# Patient Record
Sex: Female | Born: 2005 | Race: White | Hispanic: Yes | Marital: Single | Smoking: Never smoker
Health system: Southern US, Community
[De-identification: ages and names within clinical notes are randomized; demographics above are authoritative.]

## PROBLEM LIST (undated history)

## (undated) HISTORY — PX: NO PAST SURGERIES: SHX2092

---
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
## 2024-01-18 ENCOUNTER — Other Ambulatory Visit: Payer: Self-pay

## 2024-01-18 ENCOUNTER — Emergency Department (HOSPITAL_COMMUNITY)
Admission: EM | Admit: 2024-01-18 | Discharge: 2024-01-19 | Disposition: A | Discharge status: Home / Self Care | Attending: Emergency Medicine | Admitting: Emergency Medicine

## 2024-01-18 ENCOUNTER — Emergency Department (HOSPITAL_COMMUNITY)

## 2024-01-18 ENCOUNTER — Encounter (HOSPITAL_COMMUNITY): Payer: Self-pay | Admitting: Emergency Medicine

## 2024-01-18 DIAGNOSIS — W01198A Fall on same level from slipping, tripping and stumbling with subsequent striking against other object, initial encounter: Secondary | ICD-10-CM | POA: Insufficient documentation

## 2024-01-18 DIAGNOSIS — S99912A Unspecified injury of left ankle, initial encounter: Secondary | ICD-10-CM | POA: Diagnosis present

## 2024-01-18 DIAGNOSIS — Y9318 Activity, surfing, windsurfing and boogie boarding: Secondary | ICD-10-CM | POA: Insufficient documentation

## 2024-01-18 DIAGNOSIS — S82892A Other fracture of left lower leg, initial encounter for closed fracture: Secondary | ICD-10-CM | POA: Diagnosis not present

## 2024-01-18 MED ORDER — OXYCODONE HCL 5 MG PO TABS
5.0000 mg | ORAL_TABLET | Freq: Once | ORAL | Status: AC
  Administered 2024-01-18: 5 mg via ORAL
  Filled 2024-01-18: qty 1

## 2024-01-18 MED ORDER — ACETAMINOPHEN 500 MG PO TABS
1000.0000 mg | ORAL_TABLET | Freq: Once | ORAL | Status: AC
  Administered 2024-01-18: 1000 mg via ORAL
  Filled 2024-01-18: qty 2

## 2024-01-18 NOTE — ED Triage Notes (Addendum)
 Patient c/o left ankle injury. Patient report her ankle got injured after going down the slide. Obvious swelling and deformity on left ankle. 10/10 pain on left ankle.

## 2024-01-18 NOTE — ED Provider Notes (Signed)
 [facility] Provider Note   CSN: [id number] Arrival date & time: 01/18/24  2057     History Chief Complaint  Patient presents with   Joint Swelling    HPI Erin Dudley is a 18 y.o. female presenting for left ankle pain.  States that she went down a water slide and hit her left ankle as she fell.  Denies fevers chills nausea vomiting syncope shortness of breath.  Severe pain at that site.  Neurovascularly intact.  2 hours prior to arrival..   Patient's recorded medical, surgical, social, medication list and allergies were reviewed in the Snapshot window as part of the initial history.   Review of Systems   Review of Systems  Constitutional:  Negative for chills and fever.  HENT:  Negative for ear pain and sore throat.   Eyes:  Negative for pain and visual disturbance.  Respiratory:  Negative for cough and shortness of breath.   Cardiovascular:  Negative for chest pain and palpitations.  Gastrointestinal:  Negative for abdominal pain and vomiting.  Genitourinary:  Negative for dysuria and hematuria.  Musculoskeletal:  Negative for arthralgias and back pain.  Skin:  Negative for color change and rash.  Neurological:  Negative for seizures and syncope.  All other systems reviewed and are negative.   Physical Exam Updated Vital Signs BP 115/70 (BP Location: Left Arm)   Pulse (!) 113   Temp 98.3 F (36.8 C) (Oral)   Resp 20   Ht 5' 3 (1.6 m)   Wt 70.3 kg   LMP  (LMP Unknown)   SpO2 100%   BMI 27.46 kg/m  Physical Exam Vitals and nursing note reviewed.  Constitutional:      General: She is not in acute distress.    Appearance: She is well-developed.  HENT:     Head: Normocephalic and atraumatic.  Eyes:     Conjunctiva/sclera: Conjunctivae normal.  Cardiovascular:     Rate and Rhythm: Normal rate and regular rhythm.     Heart sounds: No murmur heard. Pulmonary:     Effort: Pulmonary effort is normal. No  respiratory distress.     Breath sounds: Normal breath sounds.  Abdominal:     General: There is no distension.     Palpations: Abdomen is soft.     Tenderness: There is no abdominal tenderness. There is no right CVA tenderness or left CVA tenderness.  Musculoskeletal:        General: Swelling, deformity and signs of injury present. No tenderness. Normal range of motion.     Cervical back: Neck supple.  Skin:    General: Skin is warm and dry.  Neurological:     General: No focal deficit present.     Mental Status: She is alert and oriented to person, place, and time. Mental status is at baseline.     Cranial Nerves: No cranial nerve deficit.      ED Course/ Medical Decision Making/ A&P    Procedures Procedures   Medications Ordered in ED Medications  oxyCODONE  (Oxy IR/ROXICODONE ) immediate release tablet 5 mg (5 mg Oral Given 01/18/24 2348)  acetaminophen  (TYLENOL ) tablet 1,000 mg (1,000 mg Oral Given 01/18/24 2347)    Medical Decision Making:   18 year old female presenting with left ankle pain. X-ray shows minimally displaced fractures.  Appears well aligned my review of the x-ray.  Ankle mortise visible in the anterior view. Splinted in a ankle stirrup and referred to orthopedics for outpatient care and  management.  Nonweightbearing status.  Patient educated on management.  Strict return precautions and contact information for outpatient orthopedic given to patient. Disposition:  I have considered need for hospitalization, however, considering all of the above, I believe this patient is stable for discharge at this time.  Patient/family educated about specific return precautions for given chief complaint and symptoms.  Patient/family educated about follow-up with PCP.     Patient/family expressed understanding of return precautions and need for follow-up. Patient spoken to regarding all imaging and laboratory results and appropriate follow up for these results. All education  provided in verbal form with additional information in written form. Time was allowed for answering of patient questions. Patient discharged.    Emergency Department Medication Summary:   Medications  oxyCODONE  (Oxy IR/ROXICODONE ) immediate release tablet 5 mg (5 mg Oral Given 01/18/24 2348)  acetaminophen  (TYLENOL ) tablet 1,000 mg (1,000 mg Oral Given 01/18/24 2347)        Clinical Impression:  1. Closed fracture of left ankle, initial encounter      Discharge   Final Clinical Impression(s) / ED Diagnoses Final diagnoses:  Closed fracture of left ankle, initial encounter    Rx / DC Orders ED Discharge Orders          Ordered    celecoxib  (CELEBREX ) 200 MG capsule  2 times daily        01/19/24 0047              Jerral Meth, MD 01/19/24 [phone]

## 2024-01-19 MED ORDER — CELECOXIB 200 MG PO CAPS: 200.0000 mg | ORAL_CAPSULE | Freq: Two times a day (BID) | ORAL | 0 refills | Status: AC

## 2024-01-19 NOTE — Discharge Instructions (Addendum)
 You were seen today for a fall.  You have a left ankle fracture.  You need to follow-up with orthopedics in the outpatient setting.  You have been placed into a splint for stabilization immediately.  Please call Ortho care in the morning for arranging an appointment within 48 hours.  Return to the emergency department if you are unable to coordinate outpatient care.

## 2024-01-19 NOTE — Progress Notes (Signed)
 Orthopedic Tech Progress Note Patient Details:  Erin Dudley [birth year] [id number]  Ortho Devices Type of Ortho Device: Stirrup splint Ortho Device/Splint Location: LLE Ortho Device/Splint Interventions: Ordered, Application, Adjustment   Post Interventions Patient Tolerated: Fair Instructions Provided: Care of device   [name] [name] 01/19/2024, 1:16 AM

## 2024-01-21 ENCOUNTER — Ambulatory Visit (INDEPENDENT_AMBULATORY_CARE_PROVIDER_SITE_OTHER): Admitting: Orthopedic Surgery

## 2024-01-21 ENCOUNTER — Encounter: Payer: Self-pay | Admitting: Orthopedic Surgery

## 2024-01-21 ENCOUNTER — Other Ambulatory Visit (INDEPENDENT_AMBULATORY_CARE_PROVIDER_SITE_OTHER): Payer: Self-pay

## 2024-01-21 DIAGNOSIS — S82842A Displaced bimalleolar fracture of left lower leg, initial encounter for closed fracture: Secondary | ICD-10-CM | POA: Diagnosis not present

## 2024-01-21 DIAGNOSIS — M25572 Pain in left ankle and joints of left foot: Secondary | ICD-10-CM | POA: Diagnosis not present

## 2024-01-21 MED ORDER — OXYCODONE-ACETAMINOPHEN 5-325 MG PO TABS: 1.0000 | ORAL_TABLET | ORAL | 0 refills | Status: DC | PRN

## 2024-01-21 NOTE — Addendum Note (Signed)
 Addended by: HARDEN LAME on: 01/21/2024 02:35 PM   Modules accepted: Orders

## 2024-01-21 NOTE — Progress Notes (Signed)
 Office Visit Note   Patient: Erin Dudley           Date of Birth: 2005/11/07           MRN: [id number] herminia herminia lets do that yeah thanks Visit Date: 01/21/2024              Requested by: No referring provider defined for this encounter. PCP: Pcp, No  Chief Complaint  Patient presents with   Left Ankle - Pain      HPI: Patient is a 18 year old woman who is seen for initial evaluation for bimalleolar left ankle fracture.  She is seen with her mother.  Patient sustained an injury [place] a water park.  She was placed in a posterior sugar-tong splint at [facility] on August 1 and patient is seen at this time for initial evaluation.    Assessment & Plan: Visit Diagnoses:  1. Pain in left ankle and joints of left foot   2. Closed bimalleolar fracture of left ankle, initial encounter     Plan: With the displaced bimalleolar fracture and widening of the mortise and shortening of the fibula I have recommended proceeding with open reduction internal fixation.  Risks and benefits were discussed including infection neurovascular injury pain, arthritis, need for additional surgery.  Patient and mother state they understand wish to proceed at this time.  Plan for surgery on Wednesday.  Follow-Up Instructions: Return in about 2 weeks (around 02/04/2024).   Ortho Exam  Patient is alert, oriented, no adenopathy, well-dressed, normal affect, normal respiratory effort. Examination patient has a palpable dorsalis pedis pulse.  There is ecchymosis and bruising medially and laterally to the ankle but no blistering.  There is swelling.  Radiographs show shortening of the fibula with displacement of the medial malleolus and lateral malleolus with widening of the mortise.    Imaging: XR Ankle Complete Left Result Date: 01/21/2024 Three-view radiographs of the left ankle shows further displacement of the bimalleolar left ankle fracture with widening of the joint  No images are attached to the  encounter.  Labs: Lab Results  Component Value Date   REPTSTATUS 08/19/2021 FINAL 08/17/2021   CULT (A) 08/17/2021    40,000 COLONIES/mL PROTEUS MIRABILIS NO GROUP B STREP (S.AGALACTIAE) ISOLATED Performed at [facility], [address] [place], [place] [address]    LABORGA PROTEUS MIRABILIS (A) 08/17/2021     No results found for: ALBUMIN, PREALBUMIN, CBC  No results found for: MG No results found for: VD25OH  No results found for: PREALBUMIN    Latest Ref Rng & Units 10/03/2021    7:13 PM 10/03/2021    5:05 AM 10/02/2021    1:32 AM  CBC EXTENDED  WBC 4.0 - 10.5 K/uL  11.2  9.6   RBC 3.87 - 5.11 MIL/uL  2.98  3.80   Hemoglobin 12.0 - 15.0 g/dL 8.8  6.4  8.1   HCT 63.9 - 46.0 % 27.7  21.2  26.9   Platelets 150 - 400 K/uL  261  318      There is no height or weight on file to calculate BMI.  Orders:  Orders Placed This Encounter  Procedures   XR Ankle Complete Left   No orders of the defined types were placed in this encounter.    Procedures: No procedures performed  Clinical Data: No additional findings.  ROS:  All other systems negative, except as noted in the HPI. Review of Systems  Objective: Vital Signs: LMP  (  LMP Unknown)   Specialty Comments:  No specialty comments available.  PMFS History: Patient Active Problem List   Diagnosis Date Noted   Normal labor 10/02/2021   SVD (spontaneous vaginal delivery) 10/02/2021   Iron  deficiency anemia 10/02/2021   Normal postpartum course 10/02/2021   History reviewed. No pertinent past medical history.  Family History  Problem Relation Age of Onset   Hypertension Maternal Grandmother    Diabetes Maternal Grandmother     Past Surgical History:  Procedure Laterality Date   NO PAST SURGERIES     Social History   Occupational History   Not on file  Tobacco Use   Smoking status: Never   Smokeless tobacco: Never  Vaping Use   Vaping status: Former   Substances: Flavoring    Devices: prior to pregnancy  Substance and Sexual Activity   Alcohol use: Never   Drug use: Not Currently    Types: Marijuana    Comment: DENIES SMOKING WHILE PREG   Sexual activity: Not Currently    Birth control/protection: None

## 2024-01-22 ENCOUNTER — Encounter (HOSPITAL_COMMUNITY): Payer: Self-pay | Admitting: Orthopedic Surgery

## 2024-01-22 ENCOUNTER — Other Ambulatory Visit: Payer: Self-pay

## 2024-01-22 NOTE — Progress Notes (Signed)
 SDW CALL  Patient's mother was given pre-op instructions over the phone. The opportunity was given for the patient's mother to ask questions. No further questions asked. Patient's mother verbalized understanding of instructions given.   PCP - denies Cardiologist - denies  PPM/ICD - denies   Chest x-ray - denies EKG - denies Stress Test - denies ECHO - denies Cardiac Cath - denies  Sleep Study - denies  No DM  Last dose of GLP1 agonist-  n/a GLP1 instructions:  n/a  Blood Thinner Instructions: n/a Aspirin Instructions: n/a  ERAS Protcol - clears until 1045 PRE-SURGERY Ensure or G2- n/a  COVID TEST- n/a   Anesthesia review: no

## 2024-01-22 NOTE — Anesthesia Preprocedure Evaluation (Signed)
 Anesthesia Evaluation  Patient identified by MRN, date of birth, ID band Patient awake    Reviewed: Allergy & Precautions, NPO status , Patient's Chart, lab work & pertinent test results  Airway Mallampati: II  TM Distance: >3 FB Neck ROM: Full    Dental no notable dental hx.    Pulmonary neg pulmonary ROS, Patient abstained from smoking.   Pulmonary exam normal breath sounds clear to auscultation       Cardiovascular negative cardio ROS Normal cardiovascular exam Rhythm:Regular Rate:Normal     Neuro/Psych negative neurological ROS     GI/Hepatic negative GI ROS, Neg liver ROS,,,  Endo/Other  negative endocrine ROS    Renal/GU negative Renal ROS     Musculoskeletal negative musculoskeletal ROS (+)    Abdominal Normal abdominal exam  (+)   Peds  Hematology  (+) Blood dyscrasia, anemia   Anesthesia Other Findings   Reproductive/Obstetrics                              Anesthesia Physical Anesthesia Plan  ASA: 1  Anesthesia Plan: General   Post-op Pain Management: Tylenol  PO (pre-op)*, Toradol IV (intra-op)*, Gabapentin  PO (pre-op)* and Regional block*   Induction:   PONV Risk Score and Plan: 2 and Treatment may vary due to age or medical condition, Ondansetron , Dexamethasone  and Midazolam   Airway Management Planned: LMA  Additional Equipment: None  Intra-op Plan:   Post-operative Plan: Extubation in OR  Informed Consent: I have reviewed the patients History and Physical, chart, labs and discussed the procedure including the risks, benefits and alternatives for the proposed anesthesia with the patient or authorized representative who has indicated his/her understanding and acceptance.     Dental advisory given  Plan Discussed with: CRNA  Anesthesia Plan Comments:          Anesthesia Quick Evaluation

## 2024-01-23 ENCOUNTER — Encounter (HOSPITAL_COMMUNITY): Payer: Self-pay | Admitting: Orthopedic Surgery

## 2024-01-23 ENCOUNTER — Ambulatory Visit (HOSPITAL_COMMUNITY): Payer: Self-pay | Admitting: Anesthesiology

## 2024-01-23 ENCOUNTER — Ambulatory Visit (HOSPITAL_COMMUNITY)

## 2024-01-23 ENCOUNTER — Telehealth: Payer: Self-pay | Admitting: Orthopedic Surgery

## 2024-01-23 ENCOUNTER — Ambulatory Visit (HOSPITAL_COMMUNITY)
Admission: RE | Admit: 2024-01-23 | Discharge: 2024-01-23 | Disposition: A | Discharge status: Home / Self Care | Attending: Orthopedic Surgery | Admitting: Orthopedic Surgery

## 2024-01-23 ENCOUNTER — Encounter (HOSPITAL_COMMUNITY)
Admission: RE | Disposition: A | Discharge status: Home / Self Care | Payer: Self-pay | Source: Home / Self Care | Attending: Orthopedic Surgery

## 2024-01-23 ENCOUNTER — Ambulatory Visit (HOSPITAL_BASED_OUTPATIENT_CLINIC_OR_DEPARTMENT_OTHER): Payer: Self-pay | Admitting: Anesthesiology

## 2024-01-23 ENCOUNTER — Other Ambulatory Visit: Payer: Self-pay

## 2024-01-23 DIAGNOSIS — X58XXXA Exposure to other specified factors, initial encounter: Secondary | ICD-10-CM | POA: Insufficient documentation

## 2024-01-23 DIAGNOSIS — Y92831 Amusement park as the place of occurrence of the external cause: Secondary | ICD-10-CM | POA: Insufficient documentation

## 2024-01-23 DIAGNOSIS — S82892A Other fracture of left lower leg, initial encounter for closed fracture: Secondary | ICD-10-CM | POA: Diagnosis present

## 2024-01-23 DIAGNOSIS — S82842A Displaced bimalleolar fracture of left lower leg, initial encounter for closed fracture: Secondary | ICD-10-CM | POA: Diagnosis not present

## 2024-01-23 LAB — CBC
HCT: 35.9 % — ABNORMAL LOW (ref 36.0–46.0)
Hemoglobin: 11.8 g/dL — ABNORMAL LOW (ref 12.0–15.0)
MCH: 27.6 pg (ref 26.0–34.0)
MCHC: 32.9 g/dL (ref 30.0–36.0)
MCV: 84.1 fL (ref 80.0–100.0)
Platelets: 316 K/uL (ref 150–400)
RBC: 4.27 MIL/uL (ref 3.87–5.11)
RDW: 14 % (ref 11.5–15.5)
WBC: 8.9 K/uL (ref 4.0–10.5)
nRBC: 0 % (ref 0.0–0.2)

## 2024-01-23 LAB — POCT PREGNANCY, URINE: Preg Test, Ur: NEGATIVE

## 2024-01-23 SURGERY — OPEN REDUCTION INTERNAL FIXATION (ORIF) ANKLE FRACTURE
Anesthesia: General | Site: Ankle | Laterality: Left

## 2024-01-23 MED ORDER — FENTANYL CITRATE (PF) 100 MCG/2ML IJ SOLN: 50.0000 ug | Freq: Once | INTRAMUSCULAR | Status: DC

## 2024-01-23 MED ORDER — ACETAMINOPHEN 500 MG PO TABS
1000.0000 mg | ORAL_TABLET | Freq: Once | ORAL | Status: AC
  Administered 2024-01-23: 1000 mg via ORAL
  Filled 2024-01-23: qty 2

## 2024-01-23 MED ORDER — MIDAZOLAM HCL 2 MG/2ML IJ SOLN
INTRAMUSCULAR | Status: AC
  Administered 2024-01-23: 2 mg via INTRAVENOUS
  Filled 2024-01-23: qty 2

## 2024-01-23 MED ORDER — ONDANSETRON HCL 4 MG/2ML IJ SOLN
INTRAMUSCULAR | Status: AC
  Filled 2024-01-23: qty 2

## 2024-01-23 MED ORDER — CEFAZOLIN SODIUM-DEXTROSE 2-4 GM/100ML-% IV SOLN
2.0000 g | INTRAVENOUS | Status: AC
  Administered 2024-01-23: 2 g via INTRAVENOUS
  Filled 2024-01-23: qty 100

## 2024-01-23 MED ORDER — MIDAZOLAM HCL 2 MG/2ML IJ SOLN
INTRAMUSCULAR | Status: AC
  Filled 2024-01-23: qty 2

## 2024-01-23 MED ORDER — FENTANYL CITRATE (PF) 100 MCG/2ML IJ SOLN
INTRAMUSCULAR | Status: AC
Start: 2024-01-23 — End: 2024-01-23
  Administered 2024-01-23: 100 ug via INTRAVENOUS
  Filled 2024-01-23: qty 2

## 2024-01-23 MED ORDER — VASHE WOUND IRRIGATION OPTIME
TOPICAL | Status: DC | PRN
  Administered 2024-01-23: 34 [oz_av]

## 2024-01-23 MED ORDER — ROPIVACAINE HCL 5 MG/ML IJ SOLN
INTRAMUSCULAR | Status: DC | PRN
  Administered 2024-01-23: 25 mL via PERINEURAL
  Administered 2024-01-23: 15 mL via PERINEURAL

## 2024-01-23 MED ORDER — 0.9 % SODIUM CHLORIDE (POUR BTL) OPTIME
TOPICAL | Status: DC | PRN
Start: 2024-01-23 — End: 2024-01-23
  Administered 2024-01-23: 1000 mL

## 2024-01-23 MED ORDER — FENTANYL CITRATE (PF) 100 MCG/2ML IJ SOLN
INTRAMUSCULAR | Status: AC
  Filled 2024-01-23: qty 2

## 2024-01-23 MED ORDER — PROPOFOL 500 MG/50ML IV EMUL
INTRAVENOUS | Status: DC | PRN
  Administered 2024-01-23: 125 ug/kg/min via INTRAVENOUS

## 2024-01-23 MED ORDER — MIDAZOLAM HCL 2 MG/2ML IJ SOLN
INTRAMUSCULAR | Status: DC | PRN
  Administered 2024-01-23: 2 mg via INTRAVENOUS

## 2024-01-23 MED ORDER — CLONIDINE HCL (ANALGESIA) 100 MCG/ML EP SOLN
EPIDURAL | Status: DC | PRN
Start: 2024-01-23 — End: 2024-01-23
  Administered 2024-01-23: 30 ug
  Administered 2024-01-23: 50 ug

## 2024-01-23 MED ORDER — FENTANYL CITRATE (PF) 100 MCG/2ML IJ SOLN: 100.0000 ug | Freq: Once | INTRAMUSCULAR | Status: AC

## 2024-01-23 MED ORDER — LACTATED RINGERS IV SOLN: INTRAVENOUS | Status: DC

## 2024-01-23 MED ORDER — FENTANYL CITRATE (PF) 250 MCG/5ML IJ SOLN
INTRAMUSCULAR | Status: AC
  Filled 2024-01-23: qty 5

## 2024-01-23 MED ORDER — DEXMEDETOMIDINE HCL IN NACL 80 MCG/20ML IV SOLN
INTRAVENOUS | Status: AC
  Filled 2024-01-23: qty 20

## 2024-01-23 MED ORDER — CHLORHEXIDINE GLUCONATE 0.12 % MT SOLN
15.0000 mL | OROMUCOSAL | Status: AC
  Administered 2024-01-23: 15 mL via OROMUCOSAL
  Filled 2024-01-23: qty 15

## 2024-01-23 MED ORDER — DEXAMETHASONE SODIUM PHOSPHATE 10 MG/ML IJ SOLN
INTRAMUSCULAR | Status: AC
  Filled 2024-01-23: qty 1

## 2024-01-23 MED ORDER — DEXAMETHASONE SODIUM PHOSPHATE 10 MG/ML IJ SOLN
INTRAMUSCULAR | Status: DC | PRN
  Administered 2024-01-23: 10 mg via INTRAVENOUS

## 2024-01-23 MED ORDER — LIDOCAINE 2% (20 MG/ML) 5 ML SYRINGE
INTRAMUSCULAR | Status: DC | PRN
  Administered 2024-01-23: 70 mg via INTRAVENOUS

## 2024-01-23 MED ORDER — ONDANSETRON HCL 4 MG/2ML IJ SOLN
INTRAMUSCULAR | Status: DC | PRN
  Administered 2024-01-23: 4 mg via INTRAVENOUS

## 2024-01-23 MED ORDER — MIDAZOLAM HCL 2 MG/2ML IJ SOLN: 2.0000 mg | Freq: Once | INTRAMUSCULAR | Status: AC

## 2024-01-23 MED ORDER — FENTANYL CITRATE (PF) 250 MCG/5ML IJ SOLN
INTRAMUSCULAR | Status: DC | PRN
  Administered 2024-01-23: 50 ug via INTRAVENOUS

## 2024-01-23 MED ORDER — DEXAMETHASONE SODIUM PHOSPHATE 4 MG/ML IJ SOLN
INTRAMUSCULAR | Status: DC | PRN
Start: 2024-01-23 — End: 2024-01-23
  Administered 2024-01-23: 3 mg via PERINEURAL
  Administered 2024-01-23: 2 mg via PERINEURAL

## 2024-01-23 MED ORDER — FENTANYL CITRATE (PF) 100 MCG/2ML IJ SOLN
25.0000 ug | INTRAMUSCULAR | Status: DC | PRN
  Administered 2024-01-23: 25 ug via INTRAVENOUS

## 2024-01-23 MED ORDER — DROPERIDOL 2.5 MG/ML IJ SOLN: 0.6250 mg | Freq: Once | INTRAMUSCULAR | Status: DC | PRN

## 2024-01-23 MED ORDER — PROPOFOL 10 MG/ML IV BOLUS
INTRAVENOUS | Status: DC | PRN
  Administered 2024-01-23: 200 mg via INTRAVENOUS

## 2024-01-23 MED ORDER — GABAPENTIN 300 MG PO CAPS
300.0000 mg | ORAL_CAPSULE | Freq: Once | ORAL | Status: AC
  Administered 2024-01-23: 300 mg via ORAL
  Filled 2024-01-23: qty 1

## 2024-01-23 SURGICAL SUPPLY — 37 items
BAG COUNTER SPONGE SURGICOUNT (BAG) ×1 IMPLANT
BANDAGE ESMARK 6X9 LF (GAUZE/BANDAGES/DRESSINGS) IMPLANT
BIT DRILL 110X2.5XQCK CNCT (BIT) IMPLANT
BIT DRILL 2.7XCANN QCK CNCT (BIT) IMPLANT
BNDG COHESIVE 4X5 TAN STRL LF (GAUZE/BANDAGES/DRESSINGS) ×1 IMPLANT
BNDG GAUZE DERMACEA FLUFF 4 (GAUZE/BANDAGES/DRESSINGS) ×1 IMPLANT
COVER SURGICAL LIGHT HANDLE (MISCELLANEOUS) ×1 IMPLANT
DRAPE OEC MINIVIEW 54X84 (DRAPES) IMPLANT
DRAPE U-SHAPE 47X51 STRL (DRAPES) ×1 IMPLANT
DRSG ADAPTIC 3X8 NADH LF (GAUZE/BANDAGES/DRESSINGS) ×1 IMPLANT
DURAPREP 26ML APPLICATOR (WOUND CARE) ×1 IMPLANT
ELECTRODE REM PT RTRN 9FT ADLT (ELECTROSURGICAL) ×1 IMPLANT
GAUZE PAD ABD 8X10 STRL (GAUZE/BANDAGES/DRESSINGS) ×1 IMPLANT
GAUZE SPONGE 4X4 12PLY STRL (GAUZE/BANDAGES/DRESSINGS) ×1 IMPLANT
GLOVE BIOGEL PI IND STRL 9 (GLOVE) ×1 IMPLANT
GLOVE SURG ORTHO 9.0 STRL STRW (GLOVE) ×1 IMPLANT
GOWN STRL REUS W/ TWL XL LVL3 (GOWN DISPOSABLE) ×3 IMPLANT
GUIDEWIRE PIN ORTH 6X1.6XSMTH (WIRE) IMPLANT
KIT BASIN OR (CUSTOM PROCEDURE TRAY) ×1 IMPLANT
KIT TURNOVER KIT B (KITS) ×1 IMPLANT
MANIFOLD NEPTUNE II (INSTRUMENTS) ×1 IMPLANT
NS IRRIG 1000ML POUR BTL (IV SOLUTION) ×1 IMPLANT
PACK ORTHO EXTREMITY (CUSTOM PROCEDURE TRAY) ×1 IMPLANT
PAD ARMBOARD POSITIONER FOAM (MISCELLANEOUS) ×2 IMPLANT
PLATE 6HOLE 1/3 TUBULAR (Plate) IMPLANT
SCREW CANN 1/2THD 40X4.0 (Screw) IMPLANT
SCREW CORT 2.5X20X3.5XST SM (Screw) IMPLANT
SCREW CORTICAL 3.5 16MM (Screw) IMPLANT
SCREW CORTICAL 3.5X12 (Screw) IMPLANT
SCREW CORTICAL 3.5X14 (Screw) IMPLANT
STAPLER SKIN PROX 35W (STAPLE) IMPLANT
SUCTION TUBE FRAZIER 10FR DISP (SUCTIONS) ×1 IMPLANT
SUT ETHILON 2 0 PSLX (SUTURE) IMPLANT
SUT VIC AB 2-0 CT1 TAPERPNT 27 (SUTURE) ×1 IMPLANT
TOWEL GREEN STERILE (TOWEL DISPOSABLE) ×1 IMPLANT
TOWEL GREEN STERILE FF (TOWEL DISPOSABLE) ×1 IMPLANT
TUBE CONNECTING 12X1/4 (SUCTIONS) ×1 IMPLANT

## 2024-01-23 NOTE — Op Note (Signed)
 01/23/2024  2:47 PM  PATIENT:  Erin Dudley    PRE-OPERATIVE DIAGNOSIS: Closed bimalleolar left ankle fracture  POST-OPERATIVE DIAGNOSIS:  Same  PROCEDURE:  OPEN REDUCTION INTERNAL FIXATION (ORIF) ANKLE FRACTURE, LEFT  With internal fixation of the medial malleolus and lateral malleolus. C-arm fluoroscopy to verify reduction.  SURGEON:  Jerona LULLA Sage, MD  PHYSICIAN ASSISTANT:None ANESTHESIA:   General  PREOPERATIVE INDICATIONS:  Erin Dudley is a  18 y.o. female with a diagnosis of left ankle fracture who failed conservative measures and elected for surgical management.    The risks benefits and alternatives were discussed with the patient preoperatively including but not limited to the risks of infection, bleeding, nerve injury, cardiopulmonary complications, the need for revision surgery, among others, and the patient was willing to proceed.  OPERATIVE IMPLANTS:   Implant Name Type Inv. Item Serial No. Manufacturer Lot No. LRB No. Used Action  SCREW CORTICAL 3.5X12 - [id number] Screw SCREW CORTICAL 3.5X12 [id number] [employer](ORTH,TRAU,[employer],SG)  Left 2 Implanted  SCREW CORTICAL 3.5X14 - [id number] Screw SCREW CORTICAL 3.5X14 [id number] [employer](ORTH,TRAU,BIO,SG)  Left 1 Implanted  SCREW CORTICAL 3.5 - [id number] Screw SCREW CORTICAL 3.5 [id number] [employer](ORTH,TRAU,BIO,SG)  Left 1 Implanted  SCREW CORT 2.5X20X3.5XST SM - [id number] Screw SCREW CORT [id number] SM [id number] [employer](ORTH,TRAU,BIO,SG)  Left 1 Implanted  PLATE 6HOLE 1/3 TUBULAR - [id number] Plate PLATE 6HOLE 1/3 TUBULAR [id number] [employer](ORTH,TRAU,BIO,SG)  Left 1 Implanted  SCREW CANN 1/2THD 40X4.0 - [id number] Screw SCREW CANN 1/2THD 40X4.0 [id number] [employer](ORTH,TRAU,BIO,SG)  Left 1 Implanted    @ENCIMAGES @  OPERATIVE FINDINGS: C-arm Prostabrit verified congruent mortise with the fibula out to length and congruent medial  malleolus.  OPERATIVE PROCEDURE: Patient was brought the operating room after undergoing a regional block she then underwent a general anesthetic.  After adequate levels anesthesia were obtained patient's left lower extremity was prepped using DuraPrep draped into a sterile field a timeout was called.  A lateral incision was made over the fibula sharp dissection was carried down to the periosteum.  A elevator was used to debride the tissue from the fracture site.  This fracture was irrigated with Vashe.  The fracture was reduced and stabilized with a interfrag screw.  A neutralization plate was then placed laterally and secured proximally and distally x 2.  C-arm Prostabrit verified the fibula was out to length.  A separate incision was made medially this was carried down to the fracture site.  The fracture was freshened aligned and stabilized with a K wire.  C-arm Shrosbree verified the alignment with a K wire and a 40 mm partially-threaded screw was used to stabilize the fracture.  C-arm Shrosbree was then obtained showed a congruent mortise with inline medial malleolus and lateral malleolus.  The wounds were irrigated with Vashe incision closed and 2-0 nylon sterile dressing was applied patient was extubated taken the PACU in stable condition.   DISCHARGE PLANNING:  Antibiotic duration: Preoperative antibiotics  Weightbearing: Nonweightbearing on the left  Pain medication: Patient has prescription for Percocet  Dressing care/ Wound VAC: Dry dressing  Ambulatory devices: Crutches and fracture boot  Discharge to: Home.  Follow-up: In the office 1 week post operative.

## 2024-01-23 NOTE — H&P (Signed)
 Erin Dudley is an 18 y.o. female.   Chief Complaint: Left ankle fracture HPI: Patient is a 18 year old woman who is seen for initial evaluation for bimalleolar left ankle fracture. She is seen with her mother. Patient sustained an injury at a water park. She was placed in a posterior sugar-tong splint at [facility] on August 1 and patient is seen at this time for initial evaluation.   History reviewed. No pertinent past medical history.  Past Surgical History:  Procedure Laterality Date   NO PAST SURGERIES      Family History  Problem Relation Age of Onset   Hypertension Maternal Grandmother    Diabetes Maternal Grandmother    Social History:  reports that she has never smoked. She has never used smokeless tobacco. She reports that she does not currently use drugs after having used the following drugs: Marijuana. She reports that she does not drink alcohol.  Allergies: No Known Allergies  No medications prior to admission.    No results found for this or any previous visit (from the past 48 hours). XR Ankle Complete Left Result Date: 01/21/2024 Three-view radiographs of the left ankle shows further displacement of the bimalleolar left ankle fracture with widening of the joint   Review of Systems  All other systems reviewed and are negative.   Last menstrual period 12/26/2023, unknown if currently breastfeeding. Physical Exam  Patient is alert, oriented, no adenopathy, well-dressed, normal affect, normal respiratory effort. Examination patient has a palpable dorsalis pedis pulse.  There is ecchymosis and bruising medially and laterally to the ankle but no blistering.  There is swelling.  Radiographs show shortening of the fibula with displacement of the medial malleolus and lateral malleolus with widening of the mortise. Assessment/Plan 1. Pain in left ankle and joints of left foot   2. Closed bimalleolar fracture of left ankle, initial encounter       Plan: With the  displaced bimalleolar fracture and widening of the mortise and shortening of the fibula I have recommended proceeding with open reduction internal fixation.  Risks and benefits were discussed including infection neurovascular injury pain, arthritis, need for additional surgery.  Patient and mother state they understand wish to proceed at this time.  Erin LULLA Sage, MD 01/23/2024, 6:50 AM

## 2024-01-23 NOTE — Anesthesia Procedure Notes (Signed)
 Anesthesia Regional Block: Popliteal block   Pre-Anesthetic Checklist: , timeout performed,  Correct Patient, Correct Site, Correct Laterality,  Correct Procedure, Correct Position, site marked,  Risks and benefits discussed,  Surgical consent,  Pre-op evaluation,  At surgeon's request and post-op pain management  Laterality: Lower and Left  Prep: chloraprep       Needles:  Injection technique: Single-shot  Needle Type: Stimiplex     Needle Length: 10cm  Needle Gauge: 21     Additional Needles:   Procedures:,,,, ultrasound used (permanent image in chart),,   Motor weakness within 5 minutes.  Narrative:  Start time: 01/23/2024 1:14 PM End time: 01/23/2024 1:21 PM Injection made incrementally with aspirations every 5 mL.  Performed by: Personally  Anesthesiologist: Darlyn Rush, MD  Additional Notes: Nerve located and needle positioned with direct ultrasound guidance. Good perineural spread. Patient tolerated well.

## 2024-01-23 NOTE — Interval H&P Note (Signed)
 History and Physical Interval Note:  01/23/2024 1:42 PM  Erin Dudley  has presented today for surgery, with the diagnosis of left ankle fracture.  The various methods of treatment have been discussed with the patient and family. After consideration of risks, benefits and other options for treatment, the patient has consented to  Procedure(s) with comments: OPEN REDUCTION INTERNAL FIXATION (ORIF) ANKLE FRACTURE, LEFT (Left) - left ankle fracture as a surgical intervention.  The patient's history has been reviewed, patient examined, no change in status, stable for surgery.  I have reviewed the patient's chart and labs.  Questions were answered to the patient's satisfaction.     Rayvon Brandvold V Zyan Mirkin

## 2024-01-23 NOTE — Anesthesia Procedure Notes (Signed)
 Anesthesia Regional Block: Adductor canal block   Pre-Anesthetic Checklist: , timeout performed,  Correct Patient, Correct Site, Correct Laterality,  Correct Procedure, Correct Position, site marked,  Risks and benefits discussed,  Surgical consent,  Pre-op evaluation,  At surgeon's request and post-op pain management  Laterality: Lower and Left  Prep: chloraprep       Needles:  Injection technique: Single-shot  Needle Type: Stimiplex     Needle Length: 9cm  Needle Gauge: 21     Additional Needles:   Procedures:,,,, ultrasound used (permanent image in chart),,    Narrative:  Start time: 01/23/2024 1:00 PM End time: 01/23/2024 1:14 PM Injection made incrementally with aspirations every 5 mL.  Performed by: Personally  Anesthesiologist: Darlyn Rush, MD  Additional Notes: BP cuff, EKG monitors applied. Sedation begun. Artery and nerve location verified with ultrasound. Anesthetic injected incrementally (5ml), slowly, and after negative aspirations under direct u/s guidance. Good fascial/perineural spread. Tolerated well.

## 2024-01-23 NOTE — Telephone Encounter (Signed)
 Patient called and said her appointment is too far out and she needs to be put in for a week. Also, she needs a refill on pain medication. CB#[phone]

## 2024-01-23 NOTE — Anesthesia Procedure Notes (Signed)
 Procedure Name: LMA Insertion Date/Time: 01/23/2024 1:50 PM  Performed by: Lyle Delon LABOR, CRNAPre-anesthesia Checklist: Patient identified, Emergency Drugs available, Suction available and Patient being monitored Patient Re-evaluated:Patient Re-evaluated prior to induction Oxygen Delivery Method: Circle system utilized Preoxygenation: Pre-oxygenation with 100% oxygen Induction Type: IV induction Ventilation: Mask ventilation without difficulty LMA: LMA inserted LMA Size: 4.0 Tube type: Oral Number of attempts: 1 Airway Equipment and Method: Stylet and Oral airway Placement Confirmation: positive ETCO2 and breath sounds checked- equal and bilateral Tube secured with: Tape Dental Injury: Teeth and Oropharynx as per pre-operative assessment

## 2024-01-23 NOTE — Transfer of Care (Signed)
 Immediate Anesthesia Transfer of Care Note  Patient: Erin Dudley  Procedure(s) Performed: OPEN REDUCTION INTERNAL FIXATION (ORIF) ANKLE FRACTURE, LEFT (Left: Ankle)  Patient Location: PACU  Anesthesia Type:General  Level of Consciousness: awake  Airway & Oxygen Therapy: Patient Spontanous Breathing  Post-op Assessment: Report given to RN and Post -op Vital signs reviewed and stable  Post vital signs: Reviewed and stable  Last Vitals:  Vitals Value Taken Time  BP    Temp    Pulse 69 01/23/24 15:06  Resp    SpO2 98 % 01/23/24 15:06    Last Pain:  Vitals:   01/23/24 1210  TempSrc:   PainSc: 0-No pain      Patients Stated Pain Goal: 0 (01/23/24 1210)  Complications: No notable events documented.

## 2024-01-24 ENCOUNTER — Encounter (HOSPITAL_COMMUNITY): Payer: Self-pay | Admitting: Orthopedic Surgery

## 2024-01-24 NOTE — Anesthesia Postprocedure Evaluation (Signed)
 Anesthesia Post Note  Patient: Erin Dudley  Procedure(s) Performed: OPEN REDUCTION INTERNAL FIXATION (ORIF) ANKLE FRACTURE, LEFT (Left: Ankle)     Patient location during evaluation: PACU Anesthesia Type: General Level of consciousness: sedated and patient cooperative Pain management: pain level controlled Vital Signs Assessment: post-procedure vital signs reviewed and stable Respiratory status: spontaneous breathing Cardiovascular status: stable Anesthetic complications: no   No notable events documented.  Last Vitals:  Vitals:   01/23/24 1545 01/23/24 1600  BP: 107/63 116/72  Pulse: (!) 58 97  Resp: (!) 9 15  Temp:  36.6 C  SpO2: 100% 98%    Last Pain:  Vitals:   01/23/24 1538  TempSrc:   PainSc: 7                  Norleen Pope

## 2024-01-24 NOTE — Telephone Encounter (Signed)
 Pt is sch for a week out as per protocol and was d/c with #30 pain medication yesterday.

## 2024-01-25 ENCOUNTER — Other Ambulatory Visit: Payer: Self-pay | Admitting: Family

## 2024-01-25 ENCOUNTER — Telehealth: Payer: Self-pay | Admitting: Family

## 2024-01-25 LAB — NASOPHARYNGEAL CULTURE

## 2024-01-25 MED ORDER — OXYCODONE-ACETAMINOPHEN 5-325 MG PO TABS: 1.0000 | ORAL_TABLET | Freq: Four times a day (QID) | ORAL | 0 refills | Status: AC | PRN

## 2024-01-25 MED ORDER — ONDANSETRON HCL 4 MG PO TABS: 4.0000 mg | ORAL_TABLET | Freq: Every day | ORAL | 1 refills | Status: AC | PRN

## 2024-01-25 NOTE — Telephone Encounter (Signed)
What's the message ?

## 2024-01-30 ENCOUNTER — Encounter: Payer: Self-pay | Admitting: Family

## 2024-01-30 ENCOUNTER — Ambulatory Visit (INDEPENDENT_AMBULATORY_CARE_PROVIDER_SITE_OTHER): Admitting: Family

## 2024-01-30 ENCOUNTER — Other Ambulatory Visit (INDEPENDENT_AMBULATORY_CARE_PROVIDER_SITE_OTHER): Payer: Self-pay

## 2024-01-30 DIAGNOSIS — S82842D Displaced bimalleolar fracture of left lower leg, subsequent encounter for closed fracture with routine healing: Secondary | ICD-10-CM | POA: Diagnosis not present

## 2024-01-30 NOTE — Progress Notes (Signed)
   Post-Op Visit Note   Patient: Erin Dudley           Date of Birth: 2005-09-10           MRN: [id number] Visit Date: 01/30/2024 PCP: Pcp, No  Chief Complaint:  Chief Complaint  Patient presents with   Left Ankle - Routine Post Op    01/26/2024 left ORIF ankle fracture     HPI:  HPI The patient is a 18 year old female who is seen status post ORIF for left bimalleolar ankle fracture she is 1 week out.  She reports that she did have 1 fall where she placed weight on her ankle the day after surgery Ortho Exam On examination left ankle her incisions are clean dry and intact there is minimal edema no sign of infection Visit Diagnoses:  1. Closed bimalleolar fracture of left ankle with routine healing, subsequent encounter     Plan: Radiographs of the left ankle performed today reassuring.  She will begin daily Dial soap cleansing dry dressings continue nonweightbearing  Follow-Up Instructions: No follow-ups on file.   Imaging: No results found.  Orders:  Orders Placed This Encounter  Procedures   XR Ankle Complete Left   No orders of the defined types were placed in this encounter.    PMFS History: Patient Active Problem List   Diagnosis Date Noted   Closed bimalleolar fracture of left ankle 01/23/2024   Normal labor 10/02/2021   SVD (spontaneous vaginal delivery) 10/02/2021   Iron  deficiency anemia 10/02/2021   Normal postpartum course 10/02/2021   No past medical history on file.  Family History  Problem Relation Age of Onset   Hypertension Maternal Grandmother    Diabetes Maternal Grandmother     Past Surgical History:  Procedure Laterality Date   NO PAST SURGERIES     ORIF ANKLE FRACTURE Left 01/23/2024   Procedure: OPEN REDUCTION INTERNAL FIXATION (ORIF) ANKLE FRACTURE, LEFT;  Surgeon: Harden Jerona GAILS, MD;  Location: [facility] OR;  Service: Orthopedics;  Laterality: Left;  left ankle fracture   Social History   Occupational History   Not on file  Tobacco  Use   Smoking status: Never   Smokeless tobacco: Never  Vaping Use   Vaping status: Former   Substances: Flavoring   Devices: prior to pregnancy  Substance and Sexual Activity   Alcohol use: Never   Drug use: Not Currently    Types: Marijuana    Comment: DENIES SMOKING WHILE PREG   Sexual activity: Not Currently    Birth control/protection: None

## 2024-02-06 ENCOUNTER — Ambulatory Visit (INDEPENDENT_AMBULATORY_CARE_PROVIDER_SITE_OTHER): Admitting: Family

## 2024-02-06 ENCOUNTER — Encounter: Payer: Self-pay | Admitting: Family

## 2024-02-06 ENCOUNTER — Other Ambulatory Visit (INDEPENDENT_AMBULATORY_CARE_PROVIDER_SITE_OTHER): Payer: Self-pay

## 2024-02-06 DIAGNOSIS — Z9889 Other specified postprocedural states: Secondary | ICD-10-CM

## 2024-02-06 DIAGNOSIS — Z8781 Personal history of (healed) traumatic fracture: Secondary | ICD-10-CM | POA: Diagnosis not present

## 2024-02-06 NOTE — Progress Notes (Signed)
   Post-Op Visit Note   Patient: Erin Dudley           Date of Birth: Oct 16, 2005           MRN: [id number] Visit Date: 02/06/2024 PCP: Pcp, No  Chief Complaint:  Chief Complaint  Patient presents with   Left Ankle - Routine Post Op    01/26/2024 left ORIF ankle fracture     HPI:  HPI The patient is a 18 year old woman seen status post ORIF left ankle fracture she has been nonweightbearing with her cam boot.  Has no concerns today. Ortho Exam On examination left ankle the medial and lateral incisions are well-approximated sutures these are healing well there is no gaping or drainage Visit Diagnoses:  1. S/P ORIF (open reduction internal fixation) fracture     Plan: Sutures harvested without incident.  Continue daily dose of cleansing.  Dry dressings.  Continue nonweightbearing.  Follow-Up Instructions: No follow-ups on file.   Imaging: No results found.  Orders:  Orders Placed This Encounter  Procedures   XR Ankle Complete Left   No orders of the defined types were placed in this encounter.    PMFS History: Patient Active Problem List   Diagnosis Date Noted   Closed bimalleolar fracture of left ankle 01/23/2024   Normal labor 10/02/2021   SVD (spontaneous vaginal delivery) 10/02/2021   Iron  deficiency anemia 10/02/2021   Normal postpartum course 10/02/2021   No past medical history on file.  Family History  Problem Relation Age of Onset   Hypertension Maternal Grandmother    Diabetes Maternal Grandmother     Past Surgical History:  Procedure Laterality Date   NO PAST SURGERIES     ORIF ANKLE FRACTURE Left 01/23/2024   Procedure: OPEN REDUCTION INTERNAL FIXATION (ORIF) ANKLE FRACTURE, LEFT;  Surgeon: Harden Jerona GAILS, MD;  Location: [facility] OR;  Service: Orthopedics;  Laterality: Left;  left ankle fracture   Social History   Occupational History   Not on file  Tobacco Use   Smoking status: Never   Smokeless tobacco: Never  Vaping Use   Vaping status:  Former   Substances: Flavoring   Devices: prior to pregnancy  Substance and Sexual Activity   Alcohol use: Never   Drug use: Not Currently    Types: Marijuana    Comment: DENIES SMOKING WHILE PREG   Sexual activity: Not Currently    Birth control/protection: None

## 2024-02-20 ENCOUNTER — Ambulatory Visit (INDEPENDENT_AMBULATORY_CARE_PROVIDER_SITE_OTHER): Admitting: Family

## 2024-02-20 ENCOUNTER — Other Ambulatory Visit (INDEPENDENT_AMBULATORY_CARE_PROVIDER_SITE_OTHER): Payer: Self-pay

## 2024-02-20 ENCOUNTER — Encounter: Payer: Self-pay | Admitting: Family

## 2024-02-20 DIAGNOSIS — Z9889 Other specified postprocedural states: Secondary | ICD-10-CM

## 2024-02-20 DIAGNOSIS — Z8781 Personal history of (healed) traumatic fracture: Secondary | ICD-10-CM

## 2024-02-20 DIAGNOSIS — S82842D Displaced bimalleolar fracture of left lower leg, subsequent encounter for closed fracture with routine healing: Secondary | ICD-10-CM

## 2024-02-20 NOTE — Progress Notes (Signed)
   Post-Op Visit Note   Patient: Erin Dudley           Date of Birth: 09/07/2005           MRN: [id number] Visit Date: 02/20/2024 PCP: Pcp, No  Chief Complaint: No chief complaint on file.   HPI:  HPI The patient is a 18 year old woman who presents status post open reduction internal fixation of a bimalleolar ankle fracture on the left she has been nonweightbearing in her cam boot.  No concerns today. Ortho Exam On examination left ankle there is no edema no erythema the incisions are well-healed Visit Diagnoses: No diagnosis found.  Plan: May advance to full weightbearing in her cam boot. Follow-Up Instructions: No follow-ups on file.   Imaging: No results found.  Orders:  No orders of the defined types were placed in this encounter.  No orders of the defined types were placed in this encounter.    PMFS History: Patient Active Problem List   Diagnosis Date Noted   Closed bimalleolar fracture of left ankle 01/23/2024   Normal labor 10/02/2021   SVD (spontaneous vaginal delivery) 10/02/2021   Iron  deficiency anemia 10/02/2021   Normal postpartum course 10/02/2021   No past medical history on file.  Family History  Problem Relation Age of Onset   Hypertension Maternal Grandmother    Diabetes Maternal Grandmother     Past Surgical History:  Procedure Laterality Date   NO PAST SURGERIES     ORIF ANKLE FRACTURE Left 01/23/2024   Procedure: OPEN REDUCTION INTERNAL FIXATION (ORIF) ANKLE FRACTURE, LEFT;  Surgeon: Harden Jerona GAILS, MD;  Location: [facility];  Service: Orthopedics;  Laterality: Left;  left ankle fracture   Social History   Occupational History   Not on file  Tobacco Use   Smoking status: Never   Smokeless tobacco: Never  Vaping Use   Vaping status: Former   Substances: Flavoring   Devices: prior to pregnancy  Substance and Sexual Activity   Alcohol use: Never   Drug use: Not Currently    Types: Marijuana    Comment: DENIES SMOKING WHILE PREG    Sexual activity: Not Currently    Birth control/protection: None

## 2024-03-05 ENCOUNTER — Encounter: Payer: Self-pay | Admitting: Family

## 2024-03-05 ENCOUNTER — Ambulatory Visit (INDEPENDENT_AMBULATORY_CARE_PROVIDER_SITE_OTHER): Admitting: Family

## 2024-03-05 DIAGNOSIS — Z8781 Personal history of (healed) traumatic fracture: Secondary | ICD-10-CM

## 2024-03-05 DIAGNOSIS — M25572 Pain in left ankle and joints of left foot: Secondary | ICD-10-CM

## 2024-03-05 DIAGNOSIS — S82842D Displaced bimalleolar fracture of left lower leg, subsequent encounter for closed fracture with routine healing: Secondary | ICD-10-CM

## 2024-03-05 DIAGNOSIS — Z9889 Other specified postprocedural states: Secondary | ICD-10-CM

## 2024-03-05 NOTE — Progress Notes (Signed)
 In  Office Visit Note   Patient: Erin Dudley           Date of Birth: 01/27/2006           MRN: [id number] Visit Date: 03/05/2024              Requested by: No referring provider defined for this encounter. PCP: Pcp, No  Chief Complaint  Patient presents with   Left Ankle - Routine Post Op    01/26/2024 left ORIF ankle fracture          HPI: The patient is a 18 year old female who presents status post left ankle open reduction internal fixation for bimalleolar ankle fracture she has been full weightbearing in the cam boot she continues to have a little bit of discomfort laterally distal to the malleolus she has some associated tingling she is also having mild discomfort medially to the distal ankle this aching is intermittent and occurs with activity and rest  She does feel unstable as concerned she will need some assistance regaining strengthening in the ankle  Assessment & Plan: Visit Diagnoses: No diagnosis found.  Plan: She will advance to regular shoewear given an ASO she may use this for the next 4 weeks.  She will try to wean out of it we will use this just for activities.  She will not use the ASO at physical therapy.  Have provided a referral to PT she will follow-up in office in 6 weeks if she fails to improve as expected  Follow-Up Instructions: No follow-ups on file.   Left Ankle Exam   Tenderness  Left ankle tenderness location: No bony tenderness.  Swelling: mild  Range of Motion  The patient has normal left ankle ROM.   Tests  Anterior drawer: negative  Other  Erythema: absent Sensation: normal Pulse: present  Comments:  Anterior joint line tenderness      Patient is alert, oriented, no adenopathy, well-dressed, normal affect, normal respiratory effort.     Imaging: No results found. No images are attached to the encounter.  Labs: Lab Results  Component Value Date   REPTSTATUS 01/25/2024 FINAL 01/23/2024   CULT STAPHYLOCOCCUS  AUREUS (A) 01/23/2024   LABORGA STAPHYLOCOCCUS AUREUS 01/23/2024     No results found for: ALBUMIN, PREALBUMIN, CBC  No results found for: MG No results found for: VD25OH  No results found for: PREALBUMIN    Latest Ref Rng & Units 01/23/2024   11:31 AM 10/03/2021    7:13 PM 10/03/2021    5:05 AM  CBC EXTENDED  WBC 4.0 - 10.5 K/uL 8.9   11.2   RBC 3.87 - 5.11 MIL/uL 4.27   2.98   Hemoglobin 12.0 - 15.0 g/dL 88.1  8.8  6.4   HCT 63.9 - 46.0 % 35.9  27.7  21.2   Platelets 150 - 400 K/uL 316   261      There is no height or weight on file to calculate BMI.  Orders:  No orders of the defined types were placed in this encounter.  No orders of the defined types were placed in this encounter.    Procedures: No procedures performed  Clinical Data: No additional findings.  ROS:  All other systems negative, except as noted in the HPI. Review of Systems  Objective: Vital Signs: There were no vitals taken for this visit.  Specialty Comments:  No specialty comments available.  PMFS History: Patient Active Problem List   Diagnosis Date Noted   Closed  bimalleolar fracture of left ankle 01/23/2024   Normal labor 10/02/2021   SVD (spontaneous vaginal delivery) 10/02/2021   Iron  deficiency anemia 10/02/2021   Normal postpartum course 10/02/2021   No past medical history on file.  Family History  Problem Relation Age of Onset   Hypertension Maternal Grandmother    Diabetes Maternal Grandmother     Past Surgical History:  Procedure Laterality Date   NO PAST SURGERIES     ORIF ANKLE FRACTURE Left 01/23/2024   Procedure: OPEN REDUCTION INTERNAL FIXATION (ORIF) ANKLE FRACTURE, LEFT;  Surgeon: Harden Jerona GAILS, MD;  Location: [facility] OR;  Service: Orthopedics;  Laterality: Left;  left ankle fracture   Social History   Occupational History   Not on file  Tobacco Use   Smoking status: Never   Smokeless tobacco: Never  Vaping Use   Vaping status: Former    Substances: Flavoring   Devices: prior to pregnancy  Substance and Sexual Activity   Alcohol use: Never   Drug use: Not Currently    Types: Marijuana    Comment: DENIES SMOKING WHILE PREG   Sexual activity: Not Currently    Birth control/protection: None

## 2024-03-11 NOTE — Therapy (Signed)
 OUTPATIENT PHYSICAL THERAPY LOWER EXTREMITY EVALUATION   Patient Name: Erin Dudley Dudley MRN: [id number] DOB:[birth year], 18 y.o., female Today's Date: 03/13/2024  END OF SESSION:  PT End of Session - 03/13/24 0605     Visit Number 1    Number of Visits 17    Date for Recertification  05/16/24    Authorization Type [place] [employer]ICA[employer] BLUE    Authorization - Visit Number 1    PT Start Time 1635    PT Stop Time 1720    PT Time Calculation (min) 45 min    Activity Tolerance Patient tolerated treatment well    Behavior During Therapy [facility] for tasks assessed/performed          History reviewed. No pertinent past medical history. Past Surgical History:  Procedure Laterality Date   NO PAST SURGERIES     ORIF ANKLE FRACTURE Left 01/23/2024   Procedure: OPEN REDUCTION INTERNAL FIXATION (ORIF) ANKLE FRACTURE, LEFT;  Surgeon: Harden Jerona GAILS, MD;  Location: [facility] OR;  Service: Orthopedics;  Laterality: Left;  left ankle fracture   Patient Active Problem List   Diagnosis Date Noted   Closed bimalleolar fracture of left ankle 01/23/2024   Normal labor 10/02/2021   SVD (spontaneous vaginal delivery) 10/02/2021   Iron  deficiency anemia 10/02/2021   Normal postpartum course 10/02/2021    PCP: no pcp  REFERRING PROVIDER: [name], NP   REFERRING DIAG:  Z98.890,Z87.81 (ICD-10-CM) - S/P ORIF (open reduction internal fixation) fracture  S82.842D (ICD-10-CM) - Closed bimalleolar fracture of left ankle with routine healing, subsequent encounter  M25.572 (ICD-10-CM) - Pain in left ankle and joints of left foot    THERAPY DIAG:  Pain in left ankle and joints of left foot  Stiffness of left ankle, not elsewhere classified  Muscle weakness (generalized)  Localized edema  Difficulty in walking, not elsewhere classified  Rationale for Evaluation and Treatment: Rehabilitation  ONSET DATE: 01/18/24 Fx; 01/23/24 Sx  SUBJECTIVE:   SUBJECTIVE STATEMENT: Pt reports she is doing OK.  Walking is better, but the ankle is stiff. She notes pain primarily of the medial ankle and she is still having N/T of her toes. Pain and swelling increase with prolonged time on her feet. Pt states she injured her ankle on a water slide.   PERTINENT HISTORY: 03/05/24 Office note, [name], NP: The patient is a 18 year old female who presents status post left ankle open reduction internal fixation for bimalleolar ankle fracture she has been full weightbearing in the cam boot she continues to have a little bit of discomfort laterally distal to the malleolus she has some associated tingling she is also having mild discomfort medially to the distal ankle this aching is intermittent and occurs with activity and rest   She does feel unstable as concerned she will need some assistance regaining strengthening in the ankle.  Plan: She will advance to regular shoewear given an ASO she may use this for the next 4 weeks.  She will try to wean out of it we will use this just for activities.  She will not use the ASO at physical therapy.  Have provided a referral to PT she will follow-up in office in 6 weeks if she fails to improve as expected   PAIN:  Are you having pain? Yes: NPRS scale: 5/10. 7-8/10 at night Pain location: R ankle Pain description: ache Aggravating factors: Prolonged time on feet, walking up hill, night Relieving factors: Elevation  PRECAUTIONS: To wear ASO brace outside of  PT  RED FLAGS: None   WEIGHT BEARING RESTRICTIONS: No, WBAT  FALLS:  Has patient fallen in last 6 months? No  LIVING ENVIRONMENT: Lives with: lives with their family Lives in: House/apartment Stairs: Yes: External: 4 steps; can reach both Has following equipment at home: None  OCCUPATION: Secretary  PLOF: Independent  PATIENT GOALS: less pain and improved function  NEXT MD VISIT: no additional appts at tihis time unless issues  OBJECTIVE:  Note: Objective measures were completed at Evaluation  unless otherwise noted.  DIAGNOSTIC FINDINGS: 02/20/24: Radiographs of the left ankle show stable alignment and fixation hardware.   There is no complicating feature.   PATIENT SURVEYS:  LEFS: 40/80= 50%  COGNITION: Overall cognitive status: Within functional limits for tasks assessed     SENSATION: WFL N/T of toes  EDEMA:  Present of L ankle and lower leg  POSTURE: No Significant postural limitations  PALPATION: TTP of the L peri-ankle  LOWER EXTREMITY ROM:  WFLS for L hip and knee Active ROM Right eval Left eval  Hip flexion    Hip extension    Hip abduction    Hip adduction    Hip internal rotation    Hip external rotation    Knee flexion    Knee extension    Ankle dorsiflexion 17 0 p  Ankle plantarflexion 50 30 p  Ankle inversion 30 20 p  Ankle eversion 25 18 p  P=pain  (Blank rows = not tested)  LOWER EXTREMITY MMT:  MMT Right eval Left eval  Hip flexion    Hip extension    Hip abduction    Hip adduction    Hip internal rotation    Hip external rotation    Knee flexion    Knee extension    Ankle dorsiflexion 5 4 p  Ankle plantarflexion 5 4 p  Ankle inversion 5 4 p  Ankle eversion 5 4 p   P=pain (Blank rows = not tested)  FUNCTIONAL TESTS:  5 times sit to stand: TBA 2 minute walk test: TBA  Single leg stance: TBA   GAIT: Distance walked: 150' Assistive device utilized: None Level of assistance: Complete Independence Comments: Decreased step length R due to decreased L ankle DF ROM and pain                                                                                                                                TREATMENT DATE:  [facility] Adult PT Treatment:                                                DATE: 03/12/24 Therapeutic Exercise: Developed, instructed in, and pt completed therex as noted in HEP  Self Care: RICE to manage pain and swelling    PATIENT EDUCATION:  Education details: Animal nutritionist  findings, POC, HEP, self care  Person  educated: Patient Education method: Explanation, Demonstration, Tactile cues, Verbal cues, and Handouts Education comprehension: verbalized understanding, returned demonstration, verbal cues required, and tactile cues required  HOME EXERCISE PROGRAM: Access Code: [email or web address] URL: [email or web address] Date: 03/12/2024 Prepared by: Dasie Daft  Exercises - Supine Ankle Pumps  - 2 x daily - 7 x weekly - 1 sets - 15 reps - Supine Ankle Circles  - 2 x daily - 7 x weekly - 1 sets - 15 reps - Long Sitting Calf Stretch with Strap  - 2 x daily - 7 x weekly - 1 sets - 3 reps - 30 hold - Long Sitting Ankle Plantar Flexion with Resistance  - 1 x daily - 7 x weekly - 3 sets - 10-15 reps - 3 hold - Long Sitting Ankle Inversion with Resistance (Mirrored)  - 2 x daily - 7 x weekly - 1 sets - 10-15 reps - 3 hold - Seated Ankle Eversion with Resistance  - 2 x daily - 7 x weekly - 1 sets - 10-15 reps - 3 hold - Seated Ankle Dorsiflexion with Resistance (Mirrored)  - 1-2 x daily - 7 x weekly - 1 sets - 10-15 reps - 3 hold  ASSESSMENT:  CLINICAL IMPRESSION: Patient is a 18 y.o. female who was seen today for physical therapy evaluation and treatment for  Z98.890,Z87.81 (ICD-10-CM) - S/P ORIF (open reduction internal fixation) fracture  S82.842D (ICD-10-CM) - Closed bimalleolar fracture of left ankle with routine healing, subsequent encounter  M25.572 (ICD-10-CM) - Pain in left ankle and joints of left foot  Pt presents to PT with expected swelling and pain of the L ankle, and ROM, strength, and functional limitations related to her Dx. Pt is WBAT, but has a gait deficit of decreased step length R, due to decreased L ankle DF ROM and pain. A HEP and self care was provided to the pt as above. Pt will benefit from skilled PT to address impairments to optimize L ankle function with less pain.   OBJECTIVE IMPAIRMENTS: decreased activity tolerance, decreased balance, difficulty walking, decreased ROM,  decreased strength, and pain.   ACTIVITY LIMITATIONS: squatting, stairs, locomotion level, and caring for others  PARTICIPATION LIMITATIONS: meal prep, cleaning, and laundry  PERSONAL FACTORS: Past/current experiences are also affecting patient's functional outcome.   REHAB POTENTIAL: Good  CLINICAL DECISION MAKING: Evolving/moderate complexity  EVALUATION COMPLEXITY: Moderate   GOALS:  SHORT TERM GOALS: Target date: 04/04/24 Pt will be Ind in an initial HEP  Baseline:started Goal status: INITIAL  2. Pt will voice understanding of measures to assist in pain and swelling reduction  Baseline:  Goal status: INITIAL  3.  Assess , 5xSTS, and single leg stance for functional measures Baseline:  Goal status: INITIAL  LONG TERM GOALS: Target date: 05/16/24  Pt will be Ind in a final HEP to maintain achieved LOF  Baseline:  Goal status: INITIAL  2.  Improve 5xSTS by MCID of 5 and by MCID of 75ft as indication of improved functional mobility  Baseline: TBA Goal status: INITIAL  3.  Pt will report 50% or greater decrease in L ankle pain for improved function Baseline: 7/10 Goal status: INITIAL  4.  Increase L ankle AROMs to Df 10d, PF 40d, inv 24d, and ev 22d for appropriate functional mobility especially for walking with an improved quality of gait Baseline:  Goal status: INITIAL  5.  Pt will be able to complete single leg PF x10 for  improved ankle strength with daily activities Baseline: Unable Goal status: INITIAL  6.  Pt will complete SL standing for the L LE within 85% of the R as demonstration of appropriate L ankle stability Baseline: TBA Goal status: INITIAL  7. Pt's LEFS score will improved by the MCID to 65% or greater as indication of improved function  Baseline: 50% Goal status: INITIAL   PLAN:  PT FREQUENCY: 2x/week  PT DURATION: 8 weeks  PLANNED INTERVENTIONS: 97164- PT Re-evaluation, 97110-Therapeutic exercises, 97530- Therapeutic  activity, 97112- Neuromuscular re-education, 97535- Self Care, [id number]- Manual therapy, [id number]- Gait training, [phone]- Aquatic Therapy, Patient/Family education, Balance training, Stair training, Taping, Joint mobilization, Cryotherapy, and Moist heat  PLAN FOR NEXT SESSION: Assess response to HEP; progress therex as indicated; use of modalities, manual therapy; and TPDN as indicated.   Leith Hedlund MS, PT 03/13/24 7:47 AM  For all possible CPT codes, reference the Planned Interventions line above.     Check all conditions that are expected to impact treatment: {Conditions expected to impact treatment:Musculoskeletal disorders   If treatment provided at initial evaluation, no treatment charged due to lack of authorization.

## 2024-03-12 ENCOUNTER — Ambulatory Visit: Attending: Family

## 2024-03-12 DIAGNOSIS — R600 Localized edema: Secondary | ICD-10-CM | POA: Insufficient documentation

## 2024-03-12 DIAGNOSIS — M6281 Muscle weakness (generalized): Secondary | ICD-10-CM | POA: Diagnosis present

## 2024-03-12 DIAGNOSIS — S82842D Displaced bimalleolar fracture of left lower leg, subsequent encounter for closed fracture with routine healing: Secondary | ICD-10-CM | POA: Diagnosis not present

## 2024-03-12 DIAGNOSIS — M25672 Stiffness of left ankle, not elsewhere classified: Secondary | ICD-10-CM | POA: Diagnosis present

## 2024-03-12 DIAGNOSIS — R262 Difficulty in walking, not elsewhere classified: Secondary | ICD-10-CM | POA: Insufficient documentation

## 2024-03-12 DIAGNOSIS — M25572 Pain in left ankle and joints of left foot: Secondary | ICD-10-CM | POA: Insufficient documentation

## 2024-03-12 DIAGNOSIS — Z9889 Other specified postprocedural states: Secondary | ICD-10-CM | POA: Insufficient documentation

## 2024-03-12 DIAGNOSIS — Z8781 Personal history of (healed) traumatic fracture: Secondary | ICD-10-CM | POA: Diagnosis not present

## 2024-03-13 ENCOUNTER — Other Ambulatory Visit: Payer: Self-pay

## 2024-03-25 NOTE — Therapy (Signed)
 OUTPATIENT PHYSICAL THERAPY LOWER EXTREMITY TREATMENT   Patient Name: Erin Dudley MRN: [id number] DOB:01-29-2006, 18 y.o., female Today's Date: 03/26/2024  END OF SESSION:  PT End of Session - 03/26/24 1245     Visit Number 2    Number of Visits 17    Date for Recertification  05/16/24    Authorization Type [place] MEDICAID HEALTHY BLUE    Authorization Time Period Approved 6 PT visits from 03/12/24-05/10/24    Authorization - Visit Number 2    Authorization - Number of Visits 6    PT Start Time 1102    PT Stop Time 1147    PT Time Calculation (min) 45 min    Activity Tolerance Patient tolerated treatment well    Behavior During Therapy [facility] for tasks assessed/performed           History reviewed. No pertinent past medical history. Past Surgical History:  Procedure Laterality Date   NO PAST SURGERIES     ORIF ANKLE FRACTURE Left 01/23/2024   Procedure: OPEN REDUCTION INTERNAL FIXATION (ORIF) ANKLE FRACTURE, LEFT;  Surgeon: Harden Jerona GAILS, MD;  Location: [facility] OR;  Service: Orthopedics;  Laterality: Left;  left ankle fracture   Patient Active Problem List   Diagnosis Date Noted   Closed bimalleolar fracture of left ankle 01/23/2024   Normal labor 10/02/2021   SVD (spontaneous vaginal delivery) 10/02/2021   Iron  deficiency anemia 10/02/2021   Normal postpartum course 10/02/2021    PCP: no pcp  REFERRING PROVIDER: [name], NP   REFERRING DIAG:  Z98.890,Z87.81 (ICD-10-CM) - S/P ORIF (open reduction internal fixation) fracture  S82.842D (ICD-10-CM) - Closed bimalleolar fracture of left ankle with routine healing, subsequent encounter  M25.572 (ICD-10-CM) - Pain in left ankle and joints of left foot    THERAPY DIAG:  Pain in left ankle and joints of left foot  Stiffness of left ankle, not elsewhere classified  Muscle weakness (generalized)  Localized edema  Difficulty in walking, not elsewhere classified  Rationale for Evaluation and Treatment:  Rehabilitation  ONSET DATE: 01/18/24 Fx; 01/23/24 Sx  SUBJECTIVE:   SUBJECTIVE STATEMENT: Pt reports her L ankle is doing much. She has random pain with her L ankle not related to activity.  EVAL: Pt reports she is doing OK. Walking is better, but the ankle is stiff. She notes pain primarily of the medial ankle and she is still having N/T of her toes. Pain and swelling increase with prolonged time on her feet. Pt states she injured her ankle on a water slide.   PERTINENT HISTORY: 03/05/24 Office note, [name], NP: The patient is a 18 year old female who presents status post left ankle open reduction internal fixation for bimalleolar ankle fracture she has been full weightbearing in the cam boot she continues to have a little bit of discomfort laterally distal to the malleolus she has some associated tingling she is also having mild discomfort medially to the distal ankle this aching is intermittent and occurs with activity and rest   She does feel unstable as concerned she will need some assistance regaining strengthening in the ankle.  Plan: She will advance to regular shoewear given an ASO she may use this for the next 4 weeks.  She will try to wean out of it we will use this just for activities.  She will not use the ASO at physical therapy.  Have provided a referral to PT she will follow-up in office in 6 weeks if she fails to improve as  expected   PAIN:  Are you having pain? Yes: NPRS scale: 2/10. 7-8/10 at night Pain location: R ankle Pain description: ache Aggravating factors: Prolonged time on feet, walking up hill, night Relieving factors: Elevation  PRECAUTIONS: To wear ASO brace outside of PT  RED FLAGS: None   WEIGHT BEARING RESTRICTIONS: No, WBAT  FALLS:  Has patient fallen in last 6 months? No  LIVING ENVIRONMENT: Lives with: lives with their family Lives in: House/apartment Stairs: Yes: External: 4 steps; can reach both Has following equipment at home:  None  OCCUPATION: Secretary  PLOF: Independent  PATIENT GOALS: less pain and improved function  NEXT MD VISIT: no additional appts at tihis time unless issues  OBJECTIVE:  Note: Objective measures were completed at Evaluation unless otherwise noted.  DIAGNOSTIC FINDINGS: 02/20/24: Radiographs of the left ankle show stable alignment and fixation hardware.   There is no complicating feature.   PATIENT SURVEYS:  LEFS: 40/80= 50%  COGNITION: Overall cognitive status: Within functional limits for tasks assessed     SENSATION: WFL N/T of toes  EDEMA:  Present of L ankle and lower leg  POSTURE: No Significant postural limitations  PALPATION: TTP of the L peri-ankle  LOWER EXTREMITY ROM:  WFLS for L hip and knee Active ROM Right eval Left eval LT 10/825  Hip flexion     Hip extension     Hip abduction     Hip adduction     Hip internal rotation     Hip external rotation     Knee flexion     Knee extension     Ankle dorsiflexion 17 0 p 10d  Ankle plantarflexion 50 30 p   Ankle inversion 30 20 p   Ankle eversion 25 18 p   P=pain  (Blank rows = not tested)  LOWER EXTREMITY MMT:  MMT Right eval Left eval  Hip flexion    Hip extension    Hip abduction    Hip adduction    Hip internal rotation    Hip external rotation    Knee flexion    Knee extension    Ankle dorsiflexion 5 4 p  Ankle plantarflexion 5 4 p  Ankle inversion 5 4 p  Ankle eversion 5 4 p   P=pain (Blank rows = not tested)  FUNCTIONAL TESTS:  5 times sit to stand: TBA 2 minute walk test: TBA  Single leg stance: TBA   GAIT: Distance walked: 150' Assistive device utilized: None Level of assistance: Complete Independence Comments: Decreased step length R due to decreased L ankle DF ROM and pain                                                                                                                                TREATMENT DATE:  [facility] Adult PT Treatment:  DATE: 03/26/24 Therapeutic Exercise/Activity: Rec bike 5 mins L3 Gastroc stretch 30 Soleus stretch 30 Heel raises 2x10 Long Sitting Ankle Plantar Flexion GTB 3x10 Long Sitting Ankle Inversion RTB 3x10 Seated Ankle Eversion RTB 3x10 Seated Ankle Dorsiflexion RTB 3x10 SL standing 3x 30 c hand touches as needed  [facility] Adult PT Treatment:                                                DATE: 03/12/24 Therapeutic Exercise: Developed, instructed in, and pt completed therex as noted in HEP  Self Care: RICE to manage pain and swelling    PATIENT EDUCATION:  Education details: Eval findings, POC, HEP, self care  Person educated: Patient Education method: Explanation, Demonstration, Tactile cues, Verbal cues, and Handouts Education comprehension: verbalized understanding, returned demonstration, verbal cues required, and tactile cues required  HOME EXERCISE PROGRAM: Access Code: [email or web address] URL: [email or web address] Date: 03/26/2024 Prepared by: Dasie Daft  Exercises - Supine Ankle Pumps  - 2 x daily - 7 x weekly - 1 sets - 15 reps - Supine Ankle Circles  - 2 x daily - 7 x weekly - 1 sets - 15 reps - Long Sitting Calf Stretch with Strap  - 2 x daily - 7 x weekly - 1 sets - 3 reps - 30 hold - Long Sitting Ankle Plantar Flexion with Resistance  - 1 x daily - 7 x weekly - 3 sets - 10-15 reps - 3 hold - Long Sitting Ankle Inversion with Resistance (Mirrored)  - 2 x daily - 7 x weekly - 1 sets - 10-15 reps - 3 hold - Seated Ankle Eversion with Resistance  - 2 x daily - 7 x weekly - 1 sets - 10-15 reps - 3 hold - Seated Ankle Dorsiflexion with Resistance (Mirrored)  - 1-2 x daily - 7 x weekly - 1 sets - 10-15 reps - 3 hold - Gastroc Stretch on Wall  - 1 x daily - 7 x weekly - 1 sets - 3 reps - 30 hold - Soleus Stretch on Wall  - 1 x daily - 7 x weekly - 1 sets - 3 reps - 30 hold - Heel Raises with Counter Support  - 1 x daily - 7 x weekly - 3 sets - 10 reps -  3 hold - Standing Single Leg Stance with Counter Support  - 1 x daily - 7 x weekly - 1 sets - 5 reps - 30 hold  ASSESSMENT:  CLINICAL IMPRESSION: PT was completed for L ankle mobility, strengthening, and balance. Strengthening was progressed to heel raise. Skilled PT was provided for most effective technique and assessment of tolerance. Pt returned proper demonstration. L ankle DF has increased form 0d to 10d. Pt's gait pattern is improved, becoming more normalized. HEP was updated. Pt will continue to benefit from skilled PT to address impairments for improved L ankle function with minimized pain.    EVAL: Patient is a 18 y.o. female who was seen today for physical therapy evaluation and treatment for  Z98.890,Z87.81 (ICD-10-CM) - S/P ORIF (open reduction internal fixation) fracture  S82.842D (ICD-10-CM) - Closed bimalleolar fracture of left ankle with routine healing, subsequent encounter  M25.572 (ICD-10-CM) - Pain in left ankle and joints of left foot  Pt presents to PT with expected swelling and pain of the L ankle, and ROM, strength, and functional  limitations related to her Dx. Pt is WBAT, but has a gait deficit of decreased step length R, due to decreased L ankle DF ROM and pain. A HEP and self care was provided to the pt as above. Pt will benefit from skilled PT to address impairments to optimize L ankle function with less pain.   OBJECTIVE IMPAIRMENTS: decreased activity tolerance, decreased balance, difficulty walking, decreased ROM, decreased strength, and pain.   ACTIVITY LIMITATIONS: squatting, stairs, locomotion level, and caring for others  PARTICIPATION LIMITATIONS: meal prep, cleaning, and laundry  PERSONAL FACTORS: Past/current experiences are also affecting patient's functional outcome.   REHAB POTENTIAL: Good  CLINICAL DECISION MAKING: Evolving/moderate complexity  EVALUATION COMPLEXITY: Moderate   GOALS:  SHORT TERM GOALS: Target date: 04/04/24 Pt will be Ind in  an initial HEP  Baseline:started Goal status: INITIAL  2. Pt will voice understanding of measures to assist in pain and swelling reduction  Baseline:  Goal status: INITIAL  3.  Assess , 5xSTS, and single leg stance for functional measures Baseline:  Goal status: INITIAL  LONG TERM GOALS: Target date: 05/16/24  Pt will be Ind in a final HEP to maintain achieved LOF  Baseline:  Goal status: INITIAL  2.  Improve 5xSTS by MCID of 5 and by MCID of 9ft as indication of improved functional mobility  Baseline: TBA Goal status: INITIAL  3.  Pt will report 50% or greater decrease in L ankle pain for improved function Baseline: 7/10 Goal status: INITIAL  4.  Increase L ankle AROMs to Df 10d, PF 40d, inv 24d, and ev 22d for appropriate functional mobility especially for walking with an improved quality of gait Baseline:  Goal status: INITIAL  5.  Pt will be able to complete single leg PF x10 for improved ankle strength with daily activities Baseline: Unable Goal status: INITIAL  6.  Pt will complete SL standing for the L LE within 85% of the R as demonstration of appropriate L ankle stability Baseline: TBA Goal status: INITIAL  7. Pt's LEFS score will improved by the MCID to 65% or greater as indication of improved function  Baseline: 50% Goal status: INITIAL   PLAN:  PT FREQUENCY: 2x/week  PT DURATION: 8 weeks  PLANNED INTERVENTIONS: 97164- PT Re-evaluation, 97110-Therapeutic exercises, 97530- Therapeutic activity, 97112- Neuromuscular re-education, 97535- Self Care, 02859- Manual therapy, [id number]- Gait training, [phone]- Aquatic Therapy, Patient/Family education, Balance training, Stair training, Taping, Joint mobilization, Cryotherapy, and Moist heat  PLAN FOR NEXT SESSION: Assess response to HEP; progress therex as indicated; use of modalities, manual therapy; and TPDN as indicated.   Rashaun Curl MS, PT 03/26/24 12:57 PM

## 2024-03-26 ENCOUNTER — Ambulatory Visit: Attending: Family

## 2024-03-26 DIAGNOSIS — M25672 Stiffness of left ankle, not elsewhere classified: Secondary | ICD-10-CM | POA: Insufficient documentation

## 2024-03-26 DIAGNOSIS — M25572 Pain in left ankle and joints of left foot: Secondary | ICD-10-CM | POA: Diagnosis present

## 2024-03-26 DIAGNOSIS — R262 Difficulty in walking, not elsewhere classified: Secondary | ICD-10-CM | POA: Insufficient documentation

## 2024-03-26 DIAGNOSIS — R600 Localized edema: Secondary | ICD-10-CM | POA: Insufficient documentation

## 2024-03-26 DIAGNOSIS — M6281 Muscle weakness (generalized): Secondary | ICD-10-CM | POA: Diagnosis present

## 2024-03-27 ENCOUNTER — Ambulatory Visit

## 2024-04-01 ENCOUNTER — Ambulatory Visit

## 2024-04-01 DIAGNOSIS — R600 Localized edema: Secondary | ICD-10-CM

## 2024-04-01 DIAGNOSIS — R262 Difficulty in walking, not elsewhere classified: Secondary | ICD-10-CM

## 2024-04-01 DIAGNOSIS — M25572 Pain in left ankle and joints of left foot: Secondary | ICD-10-CM

## 2024-04-01 DIAGNOSIS — M6281 Muscle weakness (generalized): Secondary | ICD-10-CM

## 2024-04-01 DIAGNOSIS — M25672 Stiffness of left ankle, not elsewhere classified: Secondary | ICD-10-CM

## 2024-04-01 NOTE — Therapy (Signed)
 OUTPATIENT PHYSICAL THERAPY LOWER EXTREMITY TREATMENT   Patient Name: Erin Dudley MRN: [id number] DOB:2005/11/28, 18 y.o., female Today's Date: 04/01/2024  END OF SESSION:  PT End of Session - 04/01/24 1346     Visit Number 3    Number of Visits 17    Date for Recertification  05/16/24    Authorization Type [place] MEDICAID HEALTHY BLUE    Authorization Time Period Approved 6 PT visits from 03/12/24-05/10/24    Authorization - Visit Number 3    Authorization - Number of Visits 6    PT Start Time 1330    PT Stop Time 1415    PT Time Calculation (min) 45 min    Activity Tolerance Patient tolerated treatment well    Behavior During Therapy [facility] for tasks assessed/performed            History reviewed. No pertinent past medical history. Past Surgical History:  Procedure Laterality Date   NO PAST SURGERIES     ORIF ANKLE FRACTURE Left 01/23/2024   Procedure: OPEN REDUCTION INTERNAL FIXATION (ORIF) ANKLE FRACTURE, LEFT;  Surgeon: Harden Jerona GAILS, MD;  Location: [facility] OR;  Service: Orthopedics;  Laterality: Left;  left ankle fracture   Patient Active Problem List   Diagnosis Date Noted   Closed bimalleolar fracture of left ankle 01/23/2024   Normal labor 10/02/2021   SVD (spontaneous vaginal delivery) 10/02/2021   Iron  deficiency anemia 10/02/2021   Normal postpartum course 10/02/2021    PCP: no pcp  REFERRING PROVIDER: [name], NP   REFERRING DIAG:  Z98.890,Z87.81 (ICD-10-CM) - S/P ORIF (open reduction internal fixation) fracture  S82.842D (ICD-10-CM) - Closed bimalleolar fracture of left ankle with routine healing, subsequent encounter  M25.572 (ICD-10-CM) - Pain in left ankle and joints of left foot    THERAPY DIAG:  Pain in left ankle and joints of left foot  Stiffness of left ankle, not elsewhere classified  Muscle weakness (generalized)  Localized edema  Difficulty in walking, not elsewhere classified  Rationale for Evaluation and Treatment:  Rehabilitation  ONSET DATE: 01/18/24 Fx; 01/23/24 Sx  SUBJECTIVE:   SUBJECTIVE STATEMENT: Pt reports her L ankle is about the same. She notices more pain when barefoot.  EVAL: Pt reports she is doing OK. Walking is better, but the ankle is stiff. She notes pain primarily of the medial ankle and she is still having N/T of her toes. Pain and swelling increase with prolonged time on her feet. Pt states she injured her ankle on a water slide.   PERTINENT HISTORY: 03/05/24 Office note, [name], NP: The patient is a 18 year old female who presents status post left ankle open reduction internal fixation for bimalleolar ankle fracture she has been full weightbearing in the cam boot she continues to have a little bit of discomfort laterally distal to the malleolus she has some associated tingling she is also having mild discomfort medially to the distal ankle this aching is intermittent and occurs with activity and rest   She does feel unstable as concerned she will need some assistance regaining strengthening in the ankle.  Plan: She will advance to regular shoewear given an ASO she may use this for the next 4 weeks.  She will try to wean out of it we will use this just for activities.  She will not use the ASO at physical therapy.  Have provided a referral to PT she will follow-up in office in 6 weeks if she fails to improve as expected   PAIN:  Are you having pain? Yes: NPRS scale: 2/10. 7-8/10 at night Pain location: R ankle Pain description: ache Aggravating factors: Prolonged time on feet, walking up hill, night Relieving factors: Elevation  PRECAUTIONS: To wear ASO brace outside of PT  RED FLAGS: None   WEIGHT BEARING RESTRICTIONS: No, WBAT  FALLS:  Has patient fallen in last 6 months? No  LIVING ENVIRONMENT: Lives with: lives with their family Lives in: House/apartment Stairs: Yes: External: 4 steps; can reach both Has following equipment at home: None  OCCUPATION:  Secretary  PLOF: Independent  PATIENT GOALS: less pain and improved function  NEXT MD VISIT: no additional appts at tihis time unless issues  OBJECTIVE:  Note: Objective measures were completed at Evaluation unless otherwise noted.  DIAGNOSTIC FINDINGS: 02/20/24: Radiographs of the left ankle show stable alignment and fixation hardware.   There is no complicating feature.   PATIENT SURVEYS:  LEFS: 40/80= 50%  COGNITION: Overall cognitive status: Within functional limits for tasks assessed     SENSATION: WFL N/T of toes  EDEMA:  Present of L ankle and lower leg  POSTURE: No Significant postural limitations  PALPATION: TTP of the L peri-ankle  LOWER EXTREMITY ROM:  WFLS for L hip and knee Active ROM Right eval Left eval LT 10/825  Hip flexion     Hip extension     Hip abduction     Hip adduction     Hip internal rotation     Hip external rotation     Knee flexion     Knee extension     Ankle dorsiflexion 17 0 p 10d  Ankle plantarflexion 50 30 p   Ankle inversion 30 20 p   Ankle eversion 25 18 p   P=pain  (Blank rows = not tested)  LOWER EXTREMITY MMT:  MMT Right eval Left eval  Hip flexion    Hip extension    Hip abduction    Hip adduction    Hip internal rotation    Hip external rotation    Knee flexion    Knee extension    Ankle dorsiflexion 5 4 p  Ankle plantarflexion 5 4 p  Ankle inversion 5 4 p  Ankle eversion 5 4 p   P=pain (Blank rows = not tested)  FUNCTIONAL TESTS:  5 times sit to stand: TBA 2 minute walk test: TBA  Single leg stance: TBA   GAIT: Distance walked: 150' Assistive device utilized: None Level of assistance: Complete Independence Comments: Decreased step length R due to decreased L ankle DF ROM and pain                                                                                                                                TREATMENT DATE:  [facility] Adult PT Treatment:  DATE: 04/01/24 Therapeutic Exercise/Activity: Ankle circles Towel scrunches 2x10 Gastroc stretch x2 30 Soleus stretch x2 30 Heel raises 3x10 STS staggered L back 3x10 Long Sitting Ankle Plantar Flexion GTB 2x10 Long Sitting Ankle Inversion RTB 2x10 Seated Ankle Eversion RTB 2x10 Seated Ankle Dorsiflexion RTB 2x10 SL standing 3x 30 c hand touches as needed Tandem on airex 17'  [facility] Adult PT Treatment:                                                DATE: 03/26/24 Therapeutic Exercise/Activity: Rec bike 5 mins L3 Gastroc stretch 30 Soleus stretch 30 Heel raises 2x10 Long Sitting Ankle Plantar Flexion GTB 3x10 Long Sitting Ankle Inversion RTB 3x10 Seated Ankle Eversion RTB 3x10 Seated Ankle Dorsiflexion RTB 3x10 SL standing 3x 30 c hand touches as needed  PATIENT EDUCATION:  Education details: Eval findings, POC, HEP, self care  Person educated: Patient Education method: Explanation, Demonstration, Tactile cues, Verbal cues, and Handouts Education comprehension: verbalized understanding, returned demonstration, verbal cues required, and tactile cues required  HOME EXERCISE PROGRAM: Access Code: [email or web address] URL: [email or web address] Date: 03/26/2024 Prepared by: Dasie Daft  Exercises - Supine Ankle Pumps  - 2 x daily - 7 x weekly - 1 sets - 15 reps - Supine Ankle Circles  - 2 x daily - 7 x weekly - 1 sets - 15 reps - Long Sitting Calf Stretch with Strap  - 2 x daily - 7 x weekly - 1 sets - 3 reps - 30 hold - Long Sitting Ankle Plantar Flexion with Resistance  - 1 x daily - 7 x weekly - 3 sets - 10-15 reps - 3 hold - Long Sitting Ankle Inversion with Resistance (Mirrored)  - 2 x daily - 7 x weekly - 1 sets - 10-15 reps - 3 hold - Seated Ankle Eversion with Resistance  - 2 x daily - 7 x weekly - 1 sets - 10-15 reps - 3 hold - Seated Ankle Dorsiflexion with Resistance (Mirrored)  - 1-2 x daily - 7 x weekly - 1 sets - 10-15 reps - 3 hold - Gastroc Stretch on  Wall  - 1 x daily - 7 x weekly - 1 sets - 3 reps - 30 hold - Soleus Stretch on Wall  - 1 x daily - 7 x weekly - 1 sets - 3 reps - 30 hold - Heel Raises with Counter Support  - 1 x daily - 7 x weekly - 3 sets - 10 reps - 3 hold - Standing Single Leg Stance with Counter Support  - 1 x daily - 7 x weekly - 1 sets - 5 reps - 30 hold Verbally added toe scrunches and staggered STS  ASSESSMENT:  CLINICAL IMPRESSION: PT was completed for L ankle mobility, strengthening, and balance. Pt is experiencing more pain of the medial arch. Toe crunches and staggered STS exs were added. Skilled PT was provided for most effective technique and assessment of tolerance. Pt demonstrates improving ROM and quality of gait. Pt will continue to benefit from skilled PT to address impairments for improved L ankle function with minimized pain.    EVAL: Patient is a 18 y.o. female who was seen today for physical therapy evaluation and treatment for  Z98.890,Z87.81 (ICD-10-CM) - S/P ORIF (open reduction internal fixation) fracture  S82.842D (ICD-10-CM) - Closed bimalleolar fracture of left ankle  with routine healing, subsequent encounter  M25.572 (ICD-10-CM) - Pain in left ankle and joints of left foot  Pt presents to PT with expected swelling and pain of the L ankle, and ROM, strength, and functional limitations related to her Dx. Pt is WBAT, but has a gait deficit of decreased step length R, due to decreased L ankle DF ROM and pain. A HEP and self care was provided to the pt as above. Pt will benefit from skilled PT to address impairments to optimize L ankle function with less pain.   OBJECTIVE IMPAIRMENTS: decreased activity tolerance, decreased balance, difficulty walking, decreased ROM, decreased strength, and pain.   ACTIVITY LIMITATIONS: squatting, stairs, locomotion level, and caring for others  PARTICIPATION LIMITATIONS: meal prep, cleaning, and laundry  PERSONAL FACTORS: Past/current experiences are also affecting  patient's functional outcome.   REHAB POTENTIAL: Good  CLINICAL DECISION MAKING: Evolving/moderate complexity  EVALUATION COMPLEXITY: Moderate   GOALS:  SHORT TERM GOALS: Target date: 04/04/24 Pt will be Ind in an initial HEP  Baseline:started Goal status: INITIAL  2. Pt will voice understanding of measures to assist in pain and swelling reduction  Baseline:  Goal status: INITIAL  3.  Assess , 5xSTS, and single leg stance for functional measures Baseline:  Goal status: INITIAL  LONG TERM GOALS: Target date: 05/16/24  Pt will be Ind in a final HEP to maintain achieved LOF  Baseline:  Goal status: INITIAL  2.  Improve 5xSTS by MCID of 5 and by MCID of 61ft as indication of improved functional mobility  Baseline: TBA Goal status: INITIAL  3.  Pt will report 50% or greater decrease in L ankle pain for improved function Baseline: 7/10 Goal status: INITIAL  4.  Increase L ankle AROMs to Df 10d, PF 40d, inv 24d, and ev 22d for appropriate functional mobility especially for walking with an improved quality of gait Baseline:  Goal status: INITIAL  5.  Pt will be able to complete single leg PF x10 for improved ankle strength with daily activities Baseline: Unable Goal status: INITIAL  6.  Pt will complete SL standing for the L LE within 85% of the R as demonstration of appropriate L ankle stability Baseline: TBA Goal status: INITIAL  7. Pt's LEFS score will improved by the MCID to 65% or greater as indication of improved function  Baseline: 50% Goal status: INITIAL   PLAN:  PT FREQUENCY: 2x/week  PT DURATION: 8 weeks  PLANNED INTERVENTIONS: 97164- PT Re-evaluation, 97110-Therapeutic exercises, 97530- Therapeutic activity, 97112- Neuromuscular re-education, 97535- Self Care, 02859- Manual therapy, [id number]- Gait training, [phone]- Aquatic Therapy, Patient/Family education, Balance training, Stair training, Taping, Joint mobilization, Cryotherapy, and Moist  heat  PLAN FOR NEXT SESSION: Assess response to HEP; progress therex as indicated; use of modalities, manual therapy; and TPDN as indicated.   Shandreka Dante MS, PT 04/01/24 2:16 PM

## 2024-04-03 ENCOUNTER — Ambulatory Visit

## 2024-04-06 NOTE — Therapy (Signed)
 OUTPATIENT PHYSICAL THERAPY LOWER EXTREMITY TREATMENT   Patient Name: Erin Dudley MRN: [id number] DOB:16-Oct-2005, 18 y.o., female Today's Date: 04/08/2024  END OF SESSION:  PT End of Session - 04/08/24 1343     Visit Number 4    Number of Visits 17    Date for Recertification  05/16/24    Authorization Type [place] MEDICAID HEALTHY BLUE    Authorization Time Period Approved 6 PT visits from 03/12/24-05/10/24    Authorization - Visit Number 4    Authorization - Number of Visits 6    PT Start Time 1335    PT Stop Time 1415    PT Time Calculation (min) 40 min    Activity Tolerance Patient tolerated treatment well    Behavior During Therapy [facility] for tasks assessed/performed             History reviewed. No pertinent past medical history. Past Surgical History:  Procedure Laterality Date   NO PAST SURGERIES     ORIF ANKLE FRACTURE Left 01/23/2024   Procedure: OPEN REDUCTION INTERNAL FIXATION (ORIF) ANKLE FRACTURE, LEFT;  Surgeon: Harden Jerona GAILS, MD;  Location: [facility] OR;  Service: Orthopedics;  Laterality: Left;  left ankle fracture   Patient Active Problem List   Diagnosis Date Noted   Closed bimalleolar fracture of left ankle 01/23/2024   Normal labor 10/02/2021   SVD (spontaneous vaginal delivery) 10/02/2021   Iron  deficiency anemia 10/02/2021   Normal postpartum course 10/02/2021    PCP: no pcp  REFERRING PROVIDER: [name], NP   REFERRING DIAG:  Z98.890,Z87.81 (ICD-10-CM) - S/P ORIF (open reduction internal fixation) fracture  S82.842D (ICD-10-CM) - Closed bimalleolar fracture of left ankle with routine healing, subsequent encounter  M25.572 (ICD-10-CM) - Pain in left ankle and joints of left foot    THERAPY DIAG:  Pain in left ankle and joints of left foot  Stiffness of left ankle, not elsewhere classified  Localized edema  Muscle weakness (generalized)  Difficulty in walking, not elsewhere classified  Rationale for Evaluation and Treatment:  Rehabilitation  ONSET DATE: 01/18/24 Fx; 01/23/24 Sx  SUBJECTIVE:   SUBJECTIVE STATEMENT: Pt reports her L ankle is about the same, maybe a little more achy now.  EVAL: Pt reports she is doing OK. Walking is better, but the ankle is stiff. She notes pain primarily of the medial ankle and she is still having N/T of her toes. Pain and swelling increase with prolonged time on her feet. Pt states she injured her ankle on a water slide.   PERTINENT HISTORY: 03/05/24 Office note, [name], NP: The patient is a 18 year old female who presents status post left ankle open reduction internal fixation for bimalleolar ankle fracture she has been full weightbearing in the cam boot she continues to have a little bit of discomfort laterally distal to the malleolus she has some associated tingling she is also having mild discomfort medially to the distal ankle this aching is intermittent and occurs with activity and rest   She does feel unstable as concerned she will need some assistance regaining strengthening in the ankle.  Plan: She will advance to regular shoewear given an ASO she may use this for the next 4 weeks.  She will try to wean out of it we will use this just for activities.  She will not use the ASO at physical therapy.  Have provided a referral to PT she will follow-up in office in 6 weeks if she fails to improve as expected  PAIN:  Are you having pain? Yes: NPRS scale: low pain and then random sharp pain of 5/10. 7-8/10 at night Pain location: R ankle Pain description: ache Aggravating factors: Prolonged time on feet, walking up hill, night Relieving factors: Elevation  PRECAUTIONS: To wear ASO brace outside of PT  RED FLAGS: None   WEIGHT BEARING RESTRICTIONS: No, WBAT  FALLS:  Has patient fallen in last 6 months? No  LIVING ENVIRONMENT: Lives with: lives with their family Lives in: House/apartment Stairs: Yes: External: 4 steps; can reach both Has following equipment at home:  None  OCCUPATION: Secretary  PLOF: Independent  PATIENT GOALS: less pain and improved function  NEXT MD VISIT: no additional appts at tihis time unless issues  OBJECTIVE:  Note: Objective measures were completed at Evaluation unless otherwise noted.  DIAGNOSTIC FINDINGS: 02/20/24: Radiographs of the left ankle show stable alignment and fixation hardware.   There is no complicating feature.   PATIENT SURVEYS:  LEFS: 40/80= 50%  COGNITION: Overall cognitive status: Within functional limits for tasks assessed     SENSATION: WFL N/T of toes  EDEMA:  Present of L ankle and lower leg  POSTURE: No Significant postural limitations  PALPATION: TTP of the L peri-ankle  LOWER EXTREMITY ROM:  WFLS for L hip and knee Active ROM Right eval Left eval LT 10/825  Hip flexion     Hip extension     Hip abduction     Hip adduction     Hip internal rotation     Hip external rotation     Knee flexion     Knee extension     Ankle dorsiflexion 17 0 p 10d  Ankle plantarflexion 50 30 p   Ankle inversion 30 20 p   Ankle eversion 25 18 p   P=pain  (Blank rows = not tested)  LOWER EXTREMITY MMT:  MMT Right eval Left eval  Hip flexion    Hip extension    Hip abduction    Hip adduction    Hip internal rotation    Hip external rotation    Knee flexion    Knee extension    Ankle dorsiflexion 5 4 p  Ankle plantarflexion 5 4 p  Ankle inversion 5 4 p  Ankle eversion 5 4 p   P=pain (Blank rows = not tested)  FUNCTIONAL TESTS:  5 times sit to stand: TBA 2 minute walk test: TBA  Single leg stance: TBA   GAIT: Distance walked: 150' Assistive device utilized: None Level of assistance: Complete Independence Comments: Decreased step length R due to decreased L ankle DF ROM and pain                                                                                                                                TREATMENT DATE:  [facility] Adult PT Treatment:  DATE: 04/08/24 Therapeutic Activity: Rec bike 5 mins L3  2 MWT=555' Slant board stretch for gastroc and soleus SL stance, bilat L=10, R=20+ Wooden rocker board A/[employer] laterally Tandem on airex 30' Marching on airex  [facility] Adult PT Treatment:                                                DATE: 04/01/24 Therapeutic Exercise/Activity: Ankle circles Towel scrunches 2x10 Gastroc stretch x2 30 Soleus stretch x2 30 Heel raises 3x10 STS staggered L back 3x10 Long Sitting Ankle Plantar Flexion GTB 2x10 Long Sitting Ankle Inversion RTB 2x10 Seated Ankle Eversion RTB 2x10 Seated Ankle Dorsiflexion RTB 2x10 SL standing 3x 30 c hand touches as needed Tandem on airex 88'  [facility] Adult PT Treatment:                                                DATE: 03/26/24 Therapeutic Exercise/Activity: Rec bike 5 mins L3 Gastroc stretch 30 Soleus stretch 30 Heel raises 2x10 Long Sitting Ankle Plantar Flexion GTB 3x10 Long Sitting Ankle Inversion RTB 3x10 Seated Ankle Eversion RTB 3x10 Seated Ankle Dorsiflexion RTB 3x10 SL standing 3x 30 c hand touches as needed  PATIENT EDUCATION:  Education details: Eval findings, POC, HEP, self care  Person educated: Patient Education method: Explanation, Demonstration, Tactile cues, Verbal cues, and Handouts Education comprehension: verbalized understanding, returned demonstration, verbal cues required, and tactile cues required  HOME EXERCISE PROGRAM: Access Code: [email or web address] URL: [email or web address] Date: 03/26/2024 Prepared by: Dasie Daft  Exercises - Supine Ankle Pumps  - 2 x daily - 7 x weekly - 1 sets - 15 reps - Supine Ankle Circles  - 2 x daily - 7 x weekly - 1 sets - 15 reps - Long Sitting Calf Stretch with Strap  - 2 x daily - 7 x weekly - 1 sets - 3 reps - 30 hold - Long Sitting Ankle Plantar Flexion with Resistance  - 1 x daily - 7 x weekly - 3 sets - 10-15 reps - 3 hold - Long Sitting  Ankle Inversion with Resistance (Mirrored)  - 2 x daily - 7 x weekly - 1 sets - 10-15 reps - 3 hold - Seated Ankle Eversion with Resistance  - 2 x daily - 7 x weekly - 1 sets - 10-15 reps - 3 hold - Seated Ankle Dorsiflexion with Resistance (Mirrored)  - 1-2 x daily - 7 x weekly - 1 sets - 10-15 reps - 3 hold - Gastroc Stretch on Wall  - 1 x daily - 7 x weekly - 1 sets - 3 reps - 30 hold - Soleus Stretch on Wall  - 1 x daily - 7 x weekly - 1 sets - 3 reps - 30 hold - Heel Raises with Counter Support  - 1 x daily - 7 x weekly - 3 sets - 10 reps - 3 hold - Standing Single Leg Stance with Counter Support  - 1 x daily - 7 x weekly - 1 sets - 5 reps - 30 hold Verbally added toe scrunches and staggered STS  ASSESSMENT:  CLINICAL IMPRESSION: PT was completed for functional activities. Assessed the pt's . She walked at a good pace and  without an antalgic pattern. Pt's SL stance time for the L LE was moderately less than the R. Pt tolerated the prescribed exs in PT today without adverse effects. Pt will continue to benefit from skilled PT to address impairments for improved l ankle/foot function.  EVAL: Patient is a 18 y.o. female who was seen today for physical therapy evaluation and treatment for  Z98.890,Z87.81 (ICD-10-CM) - S/P ORIF (open reduction internal fixation) fracture  S82.842D (ICD-10-CM) - Closed bimalleolar fracture of left ankle with routine healing, subsequent encounter  M25.572 (ICD-10-CM) - Pain in left ankle and joints of left foot  Pt presents to PT with expected swelling and pain of the L ankle, and ROM, strength, and functional limitations related to her Dx. Pt is WBAT, but has a gait deficit of decreased step length R, due to decreased L ankle DF ROM and pain. A HEP and self care was provided to the pt as above. Pt will benefit from skilled PT to address impairments to optimize L ankle function with less pain.   OBJECTIVE IMPAIRMENTS: decreased activity tolerance, decreased  balance, difficulty walking, decreased ROM, decreased strength, and pain.   ACTIVITY LIMITATIONS: squatting, stairs, locomotion level, and caring for others  PARTICIPATION LIMITATIONS: meal prep, cleaning, and laundry  PERSONAL FACTORS: Past/current experiences are also affecting patient's functional outcome.   REHAB POTENTIAL: Good  CLINICAL DECISION MAKING: Evolving/moderate complexity  EVALUATION COMPLEXITY: Moderate   GOALS:  SHORT TERM GOALS: Target date: 04/04/24 Pt will be Ind in an initial HEP  Baseline:started Goal status: MET  2. Pt will voice understanding of measures to assist in pain and swelling reduction  Baseline:  04/08/24: Uses elevation but does not like cold packs Goal status: MET  3.  Assess , 5xSTS, and single leg stance for functional measures Baseline: 04/08/24: 8.0=5xSTS; 555'= - stiffness of the L ankle, not antalgic Goal status: MET  LONG TERM GOALS: Target date: 05/16/24  Pt will be Ind in a final HEP to maintain achieved LOF  Baseline:  Goal status: INITIAL  2.  Improve 5xSTS by MCID of 5 and by MCID of 79ft as indication of improved functional mobility  Baseline: 04/08/24: 8.0=5xSTS; 555'= - stiffness of the L ankle, not antalgic Goal status: INITIAL  3.  Pt will report 50% or greater decrease in L ankle pain for improved function Baseline: 7/10 Goal status: INITIAL  4.  Increase L ankle AROMs to Df 10d, PF 40d, inv 24d, and ev 22d for appropriate functional mobility especially for walking with an improved quality of gait Baseline:  Goal status: INITIAL  5.  Pt will be able to complete single leg PF x10 for improved ankle strength with daily activities Baseline: Unable Goal status: INITIAL  6.  Pt will complete SL standing for the L LE within 85% of the R as demonstration of appropriate L ankle stability Baseline: 04/08/24= L 10; R 20+ Goal status: INITIAL  7. Pt's LEFS score will improved by the MCID to 65% or  greater as indication of improved function  Baseline: 50% Goal status: INITIAL   PLAN:  PT FREQUENCY: 2x/week  PT DURATION: 8 weeks  PLANNED INTERVENTIONS: [id number]- PT Re-evaluation, 97110-Therapeutic exercises, 97530- Therapeutic activity, 97112- Neuromuscular re-education, 97535- Self Care, [id number]- Manual therapy, [id number]- Gait training, [phone]- Aquatic Therapy, Patient/Family education, Balance training, Stair training, Taping, Joint mobilization, Cryotherapy, and Moist heat  PLAN FOR NEXT SESSION: Assess response to HEP; progress therex as indicated; use of modalities, manual therapy; and TPDN as indicated.  Maurice Fotheringham MS, PT 04/08/24 9:29 PM

## 2024-04-08 ENCOUNTER — Ambulatory Visit

## 2024-04-08 DIAGNOSIS — R262 Difficulty in walking, not elsewhere classified: Secondary | ICD-10-CM

## 2024-04-08 DIAGNOSIS — M25572 Pain in left ankle and joints of left foot: Secondary | ICD-10-CM

## 2024-04-08 DIAGNOSIS — M6281 Muscle weakness (generalized): Secondary | ICD-10-CM

## 2024-04-08 DIAGNOSIS — R600 Localized edema: Secondary | ICD-10-CM

## 2024-04-08 DIAGNOSIS — M25672 Stiffness of left ankle, not elsewhere classified: Secondary | ICD-10-CM

## 2024-04-10 ENCOUNTER — Ambulatory Visit: Admitting: Physical Therapy

## 2024-04-10 ENCOUNTER — Telehealth: Payer: Self-pay | Admitting: Physical Therapy

## 2024-04-10 NOTE — Telephone Encounter (Signed)
 Left voicemail regarding missed appointment. Left next appointment details.

## 2024-04-14 NOTE — Therapy (Incomplete)
 OUTPATIENT PHYSICAL THERAPY LOWER EXTREMITY TREATMENT   Patient Name: Erin Dudley MRN: [id number] DOB:[birth year], 18 y.o., female Today's Date: 04/14/2024  END OF SESSION:       No past medical history on file. Past Surgical History:  Procedure Laterality Date   NO PAST SURGERIES     ORIF ANKLE FRACTURE Left 01/23/2024   Procedure: OPEN REDUCTION INTERNAL FIXATION (ORIF) ANKLE FRACTURE, LEFT;  Surgeon: Harden Jerona GAILS, MD;  Location: [facility] OR;  Service: Orthopedics;  Laterality: Left;  left ankle fracture   Patient Active Problem List   Diagnosis Date Noted   Closed bimalleolar fracture of left ankle 01/23/2024   Normal labor 10/02/2021   SVD (spontaneous vaginal delivery) 10/02/2021   Iron  deficiency anemia 10/02/2021   Normal postpartum course 10/02/2021    PCP: no pcp  REFERRING PROVIDER: [name], NP   REFERRING DIAG:  Z98.890,Z87.81 (ICD-10-CM) - S/P ORIF (open reduction internal fixation) fracture  S82.842D (ICD-10-CM) - Closed bimalleolar fracture of left ankle with routine healing, subsequent encounter  M25.572 (ICD-10-CM) - Pain in left ankle and joints of left foot    THERAPY DIAG:  No diagnosis found.  Rationale for Evaluation and Treatment: Rehabilitation  ONSET DATE: 01/18/24 Fx; 01/23/24 Sx  SUBJECTIVE:   SUBJECTIVE STATEMENT: Pt reports her L ankle is about the same, maybe a little more achy now.  EVAL: Pt reports she is doing OK. Walking is better, but the ankle is stiff. She notes pain primarily of the medial ankle and she is still having N/T of her toes. Pain and swelling increase with prolonged time on her feet. Pt states she injured her ankle on a water slide.   PERTINENT HISTORY: 03/05/24 Office note, [name], NP: The patient is a 18 year old female who presents status post left ankle open reduction internal fixation for bimalleolar ankle fracture she has been full weightbearing in the cam boot she continues to have a little bit of  discomfort laterally distal to the malleolus she has some associated tingling she is also having mild discomfort medially to the distal ankle this aching is intermittent and occurs with activity and rest   She does feel unstable as concerned she will need some assistance regaining strengthening in the ankle.  Plan: She will advance to regular shoewear given an ASO she may use this for the next 4 weeks.  She will try to wean out of it we will use this just for activities.  She will not use the ASO at physical therapy.  Have provided a referral to PT she will follow-up in office in 6 weeks if she fails to improve as expected   PAIN:  Are you having pain? Yes: NPRS scale: low pain and then random sharp pain of 5/10. 7-8/10 at night Pain location: R ankle Pain description: ache Aggravating factors: Prolonged time on feet, walking up hill, night Relieving factors: Elevation  PRECAUTIONS: To wear ASO brace outside of PT  RED FLAGS: None   WEIGHT BEARING RESTRICTIONS: No, WBAT  FALLS:  Has patient fallen in last 6 months? No  LIVING ENVIRONMENT: Lives with: lives with their family Lives in: House/apartment Stairs: Yes: External: 4 steps; can reach both Has following equipment at home: None  OCCUPATION: Secretary  PLOF: Independent  PATIENT GOALS: less pain and improved function  NEXT MD VISIT: no additional appts at tihis time unless issues  OBJECTIVE:  Note: Objective measures were completed at Evaluation unless otherwise noted.  DIAGNOSTIC FINDINGS: 02/20/24: Radiographs of the left  ankle show stable alignment and fixation hardware.   There is no complicating feature.   PATIENT SURVEYS:  LEFS: 40/80= 50%  COGNITION: Overall cognitive status: Within functional limits for tasks assessed     SENSATION: WFL N/T of toes  EDEMA:  Present of L ankle and lower leg  POSTURE: No Significant postural limitations  PALPATION: TTP of the L peri-ankle  LOWER EXTREMITY ROM:   WFLS for L hip and knee Active ROM Right eval Left eval LT 10/825  Hip flexion     Hip extension     Hip abduction     Hip adduction     Hip internal rotation     Hip external rotation     Knee flexion     Knee extension     Ankle dorsiflexion 17 0 p 10d  Ankle plantarflexion 50 30 p   Ankle inversion 30 20 p   Ankle eversion 25 18 p   P=pain  (Blank rows = not tested)  LOWER EXTREMITY MMT:  MMT Right eval Left eval  Hip flexion    Hip extension    Hip abduction    Hip adduction    Hip internal rotation    Hip external rotation    Knee flexion    Knee extension    Ankle dorsiflexion 5 4 p  Ankle plantarflexion 5 4 p  Ankle inversion 5 4 p  Ankle eversion 5 4 p   P=pain (Blank rows = not tested)  FUNCTIONAL TESTS:  5 times sit to stand: TBA 2 minute walk test: TBA  Single leg stance: TBA   GAIT: Distance walked: 150' Assistive device utilized: None Level of assistance: Complete Independence Comments: Decreased step length R due to decreased L ankle DF ROM and pain                                                                                                                                TREATMENT DATE:  [facility] Adult PT Treatment:                                                DATE: 04/15/24 Therapeutic Activity: Rec bike 5 mins L3  2 MWT=555' Slant board stretch for gastroc and soleus SL stance, bilat L=10, R=20+ Wooden rocker board A/[employer] laterally Tandem on airex 30' Marching on airex Therapeutic Exercise: *** Manual Therapy: *** Neuromuscular re-ed: *** Therapeutic Activity: *** Modalities: *** Self Care: [facility] Adult PT Treatment:                                                DATE: 04/08/24 Therapeutic Activity: Rec bike 5  mins L3  2 MWT=555' Slant board stretch for gastroc and soleus SL stance, bilat L=10, R=20+ Wooden rocker board A/[employer] laterally Tandem on airex 30' Marching on  airex  [facility] Adult PT Treatment:                                                DATE: 04/01/24 Therapeutic Exercise/Activity: Ankle circles Towel scrunches 2x10 Gastroc stretch x2 30 Soleus stretch x2 30 Heel raises 3x10 STS staggered L back 3x10 Long Sitting Ankle Plantar Flexion GTB 2x10 Long Sitting Ankle Inversion RTB 2x10 Seated Ankle Eversion RTB 2x10 Seated Ankle Dorsiflexion RTB 2x10 SL standing 3x 30 c hand touches as needed Tandem on airex 77'  [facility] Adult PT Treatment:                                                DATE: 03/26/24 Therapeutic Exercise/Activity: Rec bike 5 mins L3 Gastroc stretch 30 Soleus stretch 30 Heel raises 2x10 Long Sitting Ankle Plantar Flexion GTB 3x10 Long Sitting Ankle Inversion RTB 3x10 Seated Ankle Eversion RTB 3x10 Seated Ankle Dorsiflexion RTB 3x10 SL standing 3x 30 c hand touches as needed  PATIENT EDUCATION:  Education details: Eval findings, POC, HEP, self care  Person educated: Patient Education method: Explanation, Demonstration, Tactile cues, Verbal cues, and Handouts Education comprehension: verbalized understanding, returned demonstration, verbal cues required, and tactile cues required  HOME EXERCISE PROGRAM: Access Code: [email or web address] URL: [email or web address] Date: 03/26/2024 Prepared by: Dasie Daft  Exercises - Supine Ankle Pumps  - 2 x daily - 7 x weekly - 1 sets - 15 reps - Supine Ankle Circles  - 2 x daily - 7 x weekly - 1 sets - 15 reps - Long Sitting Calf Stretch with Strap  - 2 x daily - 7 x weekly - 1 sets - 3 reps - 30 hold - Long Sitting Ankle Plantar Flexion with Resistance  - 1 x daily - 7 x weekly - 3 sets - 10-15 reps - 3 hold - Long Sitting Ankle Inversion with Resistance (Mirrored)  - 2 x daily - 7 x weekly - 1 sets - 10-15 reps - 3 hold - Seated Ankle Eversion with Resistance  - 2 x daily - 7 x weekly - 1 sets - 10-15 reps - 3 hold - Seated Ankle Dorsiflexion with Resistance (Mirrored)  -  1-2 x daily - 7 x weekly - 1 sets - 10-15 reps - 3 hold - Gastroc Stretch on Wall  - 1 x daily - 7 x weekly - 1 sets - 3 reps - 30 hold - Soleus Stretch on Wall  - 1 x daily - 7 x weekly - 1 sets - 3 reps - 30 hold - Heel Raises with Counter Support  - 1 x daily - 7 x weekly - 3 sets - 10 reps - 3 hold - Standing Single Leg Stance with Counter Support  - 1 x daily - 7 x weekly - 1 sets - 5 reps - 30 hold Verbally added toe scrunches and staggered STS  ASSESSMENT:  CLINICAL IMPRESSION: PT was completed for functional activities. Assessed the pt's . She walked at a good pace and without an antalgic pattern. Pt's SL  stance time for the L LE was moderately less than the R. Pt tolerated the prescribed exs in PT today without adverse effects. Pt will continue to benefit from skilled PT to address impairments for improved l ankle/foot function.  EVAL: Patient is a 18 y.o. female who was seen today for physical therapy evaluation and treatment for  Z98.890,Z87.81 (ICD-10-CM) - S/P ORIF (open reduction internal fixation) fracture  S82.842D (ICD-10-CM) - Closed bimalleolar fracture of left ankle with routine healing, subsequent encounter  M25.572 (ICD-10-CM) - Pain in left ankle and joints of left foot  Pt presents to PT with expected swelling and pain of the L ankle, and ROM, strength, and functional limitations related to her Dx. Pt is WBAT, but has a gait deficit of decreased step length R, due to decreased L ankle DF ROM and pain. A HEP and self care was provided to the pt as above. Pt will benefit from skilled PT to address impairments to optimize L ankle function with less pain.   OBJECTIVE IMPAIRMENTS: decreased activity tolerance, decreased balance, difficulty walking, decreased ROM, decreased strength, and pain.   ACTIVITY LIMITATIONS: squatting, stairs, locomotion level, and caring for others  PARTICIPATION LIMITATIONS: meal prep, cleaning, and laundry  PERSONAL FACTORS: Past/current  experiences are also affecting patient's functional outcome.   REHAB POTENTIAL: Good  CLINICAL DECISION MAKING: Evolving/moderate complexity  EVALUATION COMPLEXITY: Moderate   GOALS:  SHORT TERM GOALS: Target date: 04/04/24 Pt will be Ind in an initial HEP  Baseline:started Goal status: MET  2. Pt will voice understanding of measures to assist in pain and swelling reduction  Baseline:  04/08/24: Uses elevation but does not like cold packs Goal status: MET  3.  Assess , 5xSTS, and single leg stance for functional measures Baseline: 04/08/24: 8.0=5xSTS; 555'= - stiffness of the L ankle, not antalgic Goal status: MET  LONG TERM GOALS: Target date: 05/16/24  Pt will be Ind in a final HEP to maintain achieved LOF  Baseline:  Goal status: INITIAL  2.  Improve 5xSTS by MCID of 5 and by MCID of 55ft as indication of improved functional mobility  Baseline: 04/08/24: 8.0=5xSTS; 555'= - stiffness of the L ankle, not antalgic Goal status: INITIAL  3.  Pt will report 50% or greater decrease in L ankle pain for improved function Baseline: 7/10 Goal status: INITIAL  4.  Increase L ankle AROMs to Df 10d, PF 40d, inv 24d, and ev 22d for appropriate functional mobility especially for walking with an improved quality of gait Baseline:  Goal status: INITIAL  5.  Pt will be able to complete single leg PF x10 for improved ankle strength with daily activities Baseline: Unable Goal status: INITIAL  6.  Pt will complete SL standing for the L LE within 85% of the R as demonstration of appropriate L ankle stability Baseline: 04/08/24= L 10; R 20+ Goal status: INITIAL  7. Pt's LEFS score will improved by the MCID to 65% or greater as indication of improved function  Baseline: 50% Goal status: INITIAL   PLAN:  PT FREQUENCY: 2x/week  PT DURATION: 8 weeks  PLANNED INTERVENTIONS: 97164- PT Re-evaluation, 97110-Therapeutic exercises, 97530- Therapeutic activity,  97112- Neuromuscular re-education, 97535- Self Care, 02859- Manual therapy, [id number]- Gait training, [phone]- Aquatic Therapy, Patient/Family education, Balance training, Stair training, Taping, Joint mobilization, Cryotherapy, and Moist heat  PLAN FOR NEXT SESSION: Assess response to HEP; progress therex as indicated; use of modalities, manual therapy; and TPDN as indicated.   Dasie Daft MS, PT  04/14/24 10:06 PM

## 2024-04-15 ENCOUNTER — Ambulatory Visit

## 2024-04-16 ENCOUNTER — Telehealth: Payer: Self-pay

## 2024-04-16 NOTE — Telephone Encounter (Signed)
 VM was provided re: no show appt and a reminder for her next appt. Reviewed attendance policy.

## 2024-04-16 NOTE — Therapy (Signed)
 OUTPATIENT PHYSICAL THERAPY LOWER EXTREMITY TREATMENT   Patient Name: Erin Dudley MRN: [id number] DOB:2005-08-10, 18 y.o., female Today's Date: 04/17/2024  END OF SESSION:  PT End of Session - 04/17/24 1344     Visit Number 5    Number of Visits 17    Date for Recertification  05/16/24    Authorization Type [place] MEDICA[employer] BLUE    Authorization Time Period Approved 6 PT visits from 03/12/24-05/10/24    Authorization - Visit Number 5    Authorization - Number of Visits 6    PT Start Time 1340    PT Stop Time 1418    PT Time Calculation (min) 38 min    Activity Tolerance Patient tolerated treatment well    Behavior During Therapy [facility] for tasks assessed/performed              History reviewed. No pertinent past medical history. Past Surgical History:  Procedure Laterality Date   NO PAST SURGERIES     ORIF ANKLE FRACTURE Left 01/23/2024   Procedure: OPEN REDUCTION INTERNAL FIXATION (ORIF) ANKLE FRACTURE, LEFT;  Surgeon: Harden Jerona GAILS, MD;  Location: [facility] OR;  Service: Orthopedics;  Laterality: Left;  left ankle fracture   Patient Active Problem List   Diagnosis Date Noted   Closed bimalleolar fracture of left ankle 01/23/2024   Normal labor 10/02/2021   SVD (spontaneous vaginal delivery) 10/02/2021   Iron  deficiency anemia 10/02/2021   Normal postpartum course 10/02/2021    PCP: no pcp  REFERRING PROVIDER: [name], NP   REFERRING DIAG:  Z98.890,Z87.81 (ICD-10-CM) - S/P ORIF (open reduction internal fixation) fracture  S82.842D (ICD-10-CM) - Closed bimalleolar fracture of left ankle with routine healing, subsequent encounter  M25.572 (ICD-10-CM) - Pain in left ankle and joints of left foot    THERAPY DIAG:  Pain in left ankle and joints of left foot  Stiffness of left ankle, not elsewhere classified  Localized edema  Muscle weakness (generalized)  Difficulty in walking, not elsewhere classified  Rationale for Evaluation and Treatment:  Rehabilitation  ONSET DATE: 01/18/24 Fx; 01/23/24 Sx  SUBJECTIVE:   SUBJECTIVE STATEMENT: Pt reports her L ankle has been more sore and stiff with the colder weather. Pt reports the most significant pain with her arch.  EVAL: Pt reports she is doing OK. Walking is better, but the ankle is stiff. She notes pain primarily of the medial ankle and she is still having N/T of her toes. Pain and swelling increase with prolonged time on her feet. Pt states she injured her ankle on a water slide.   PERTINENT HISTORY: 03/05/24 Office note, [name], NP: The patient is a 18 year old female who presents status post left ankle open reduction internal fixation for bimalleolar ankle fracture she has been full weightbearing in the cam boot she continues to have a little bit of discomfort laterally distal to the malleolus she has some associated tingling she is also having mild discomfort medially to the distal ankle this aching is intermittent and occurs with activity and rest   She does feel unstable as concerned she will need some assistance regaining strengthening in the ankle.  Plan: She will advance to regular shoewear given an ASO she may use this for the next 4 weeks.  She will try to wean out of it we will use this just for activities.  She will not use the ASO at physical therapy.  Have provided a referral to PT she will follow-up in office in 6  weeks if she fails to improve as expected   PAIN:  Are you having pain? Yes: NPRS scale: low pain and then random sharp pain of 3/10. 7-8/10 at night Pain location: R ankle Pain description: ache Aggravating factors: Prolonged time on feet, walking up hill, night Relieving factors: Elevation  PRECAUTIONS: To wear ASO brace outside of PT  RED FLAGS: None   WEIGHT BEARING RESTRICTIONS: No, WBAT  FALLS:  Has patient fallen in last 6 months? No  LIVING ENVIRONMENT: Lives with: lives with their family Lives in: House/apartment Stairs: Yes: External: 4  steps; can reach both Has following equipment at home: None  OCCUPATION: Secretary  PLOF: Independent  PATIENT GOALS: less pain and improved function  NEXT MD VISIT: no additional appts at tihis time unless issues  OBJECTIVE:  Note: Objective measures were completed at Evaluation unless otherwise noted.  DIAGNOSTIC FINDINGS: 02/20/24: Radiographs of the left ankle show stable alignment and fixation hardware.   There is no complicating feature.   PATIENT SURVEYS:  LEFS: 40/80= 50%  COGNITION: Overall cognitive status: Within functional limits for tasks assessed     SENSATION: WFL N/T of toes  EDEMA:  Present of L ankle and lower leg  POSTURE: No Significant postural limitations  PALPATION: TTP of the L peri-ankle  LOWER EXTREMITY ROM:  WFLS for L hip and knee Active ROM Right eval Left eval LT 10/825  Hip flexion     Hip extension     Hip abduction     Hip adduction     Hip internal rotation     Hip external rotation     Knee flexion     Knee extension     Ankle dorsiflexion 17 0 p 10d  Ankle plantarflexion 50 30 p   Ankle inversion 30 20 p   Ankle eversion 25 18 p   P=pain  (Blank rows = not tested)  LOWER EXTREMITY MMT:  MMT Right eval Left eval  Hip flexion    Hip extension    Hip abduction    Hip adduction    Hip internal rotation    Hip external rotation    Knee flexion    Knee extension    Ankle dorsiflexion 5 4 p  Ankle plantarflexion 5 4 p  Ankle inversion 5 4 p  Ankle eversion 5 4 p   P=pain (Blank rows = not tested)  FUNCTIONAL TESTS:  5 times sit to stand: TBA 2 minute walk test: TBA  Single leg stance: TBA   GAIT: Distance walked: 150' Assistive device utilized: None Level of assistance: Complete Independence Comments: Decreased step length R due to decreased L ankle DF ROM and pain                                                                                                                      TREATMENT DATE:  [facility]  Adult PT Treatment:  DATE: 04/17/24 Therapeutic Activity: Rec bike 5 mins L3  BAPS board L1 Standing stretch for gastroc and soleus Wooden rocker board [employer] laterally Lateral steps on airex Marching on airex Heel raises 2x10 Side steps c RTB  [facility] Adult PT Treatment:                                                DATE: 04/08/24 Therapeutic Activity: Rec bike 5 mins L3  2 MWT=555' Slant board stretch for gastroc and soleus SL stance, bilat L=10, R=20+ Haematologist [employer] laterally Tandem on airex 30' Marching on airex  PATIENT EDUCATION:  Education details: Eval findings, POC, HEP, self care  Person educated: Patient Education method: Explanation, Demonstration, Tactile cues, Verbal cues, and Handouts Education comprehension: verbalized understanding, returned demonstration, verbal cues required, and tactile cues required  HOME EXERCISE PROGRAM: Access Code: [email or web address] URL: [email or web address] Date: 03/26/2024 Prepared by: Dasie Daft  Exercises - Supine Ankle Pumps  - 2 x daily - 7 x weekly - 1 sets - 15 reps - Supine Ankle Circles  - 2 x daily - 7 x weekly - 1 sets - 15 reps - Long Sitting Calf Stretch with Strap  - 2 x daily - 7 x weekly - 1 sets - 3 reps - 30 hold - Long Sitting Ankle Plantar Flexion with Resistance  - 1 x daily - 7 x weekly - 3 sets - 10-15 reps - 3 hold - Long Sitting Ankle Inversion with Resistance (Mirrored)  - 2 x daily - 7 x weekly - 1 sets - 10-15 reps - 3 hold - Seated Ankle Eversion with Resistance  - 2 x daily - 7 x weekly - 1 sets - 10-15 reps - 3 hold - Seated Ankle Dorsiflexion with Resistance (Mirrored)  - 1-2 x daily - 7 x weekly - 1 sets - 10-15 reps - 3 hold - Gastroc Stretch on Wall  - 1 x daily - 7 x weekly - 1 sets - 3 reps - 30 hold - Soleus Stretch on Wall  - 1 x daily - 7 x weekly - 1 sets - 3 reps - 30 hold - Heel Raises with  Counter Support  - 1 x daily - 7 x weekly - 3 sets - 10 reps - 3 hold - Standing Single Leg Stance with Counter Support  - 1 x daily - 7 x weekly - 1 sets - 5 reps - 30 hold Verbally added toe scrunches and staggered STS  ASSESSMENT:  CLINICAL IMPRESSION: PT was completed for ROM, strengthening, and therapeutic activities. Pt tolerated PT today with some complaints re: arch pain. Pt's walking pattern looks good with an equal step length and without antalgic deficit. Overall, function of the L ankle and foot is improved. Pt is to return in 2 weeks. Will complete a reassessment at that time and will make recommendations for DC or continuation of PT.   EVAL: Patient is a 18 y.o. female who was seen today for physical therapy evaluation and treatment for  Z98.890,Z87.81 (ICD-10-CM) - S/P ORIF (open reduction internal fixation) fracture  S82.842D (ICD-10-CM) - Closed bimalleolar fracture of left ankle with routine healing, subsequent encounter  M25.572 (ICD-10-CM) - Pain in left ankle and joints of left foot  Pt presents to PT with expected swelling and pain of the L  ankle, and ROM, strength, and functional limitations related to her Dx. Pt is WBAT, but has a gait deficit of decreased step length R, due to decreased L ankle DF ROM and pain. A HEP and self care was provided to the pt as above. Pt will benefit from skilled PT to address impairments to optimize L ankle function with less pain.   OBJECTIVE IMPAIRMENTS: decreased activity tolerance, decreased balance, difficulty walking, decreased ROM, decreased strength, and pain.   ACTIVITY LIMITATIONS: squatting, stairs, locomotion level, and caring for others  PARTICIPATION LIMITATIONS: meal prep, cleaning, and laundry  PERSONAL FACTORS: Past/current experiences are also affecting patient's functional outcome.   REHAB POTENTIAL: Good  CLINICAL DECISION MAKING: Evolving/moderate complexity  EVALUATION COMPLEXITY: Moderate   GOALS:  SHORT  TERM GOALS: Target date: 04/04/24 Pt will be Ind in an initial HEP  Baseline:started Goal status: MET  2. Pt will voice understanding of measures to assist in pain and swelling reduction  Baseline:  04/08/24: Uses elevation but does not like cold packs Goal status: MET  3.  Assess , 5xSTS, and single leg stance for functional measures Baseline: 04/08/24: 8.0=5xSTS; 555'= - stiffness of the L ankle, not antalgic Goal status: MET  LONG TERM GOALS: Target date: 05/16/24  Pt will be Ind in a final HEP to maintain achieved LOF  Baseline:  Goal status: INITIAL  2.  Improve 5xSTS by MCID of 5 and by MCID of 102ft as indication of improved functional mobility  Baseline: 04/08/24: 8.0=5xSTS; 555'= - stiffness of the L ankle, not antalgic Goal status: INITIAL  3.  Pt will report 50% or greater decrease in L ankle pain for improved function Baseline: 7/10 Goal status: INITIAL  4.  Increase L ankle AROMs to Df 10d, PF 40d, inv 24d, and ev 22d for appropriate functional mobility especially for walking with an improved quality of gait Baseline:  Goal status: INITIAL  5.  Pt will be able to complete single leg PF x10 for improved ankle strength with daily activities Baseline: Unable Goal status: INITIAL  6.  Pt will complete SL standing for the L LE within 85% of the R as demonstration of appropriate L ankle stability Baseline: 04/08/24= L 10; R 20+ Goal status: INITIAL  7. Pt's LEFS score will improved by the MCID to 65% or greater as indication of improved function  Baseline: 50% Goal status: INITIAL   PLAN:  PT FREQUENCY: 2x/week  PT DURATION: 8 weeks  PLANNED INTERVENTIONS: 97164- PT Re-evaluation, 97110-Therapeutic exercises, 97530- Therapeutic activity, 97112- Neuromuscular re-education, 97535- Self Care, 02859- Manual therapy, [id number]- Gait training, [phone]- Aquatic Therapy, Patient/Family education, Balance training, Stair training, Taping, Joint  mobilization, Cryotherapy, and Moist heat  PLAN FOR NEXT SESSION: Assess response to HEP; progress therex as indicated; use of modalities, manual therapy; and TPDN as indicated.   Nysa Sarin MS, PT 04/17/24 3:33 PM

## 2024-04-17 ENCOUNTER — Ambulatory Visit

## 2024-04-17 DIAGNOSIS — M25672 Stiffness of left ankle, not elsewhere classified: Secondary | ICD-10-CM

## 2024-04-17 DIAGNOSIS — M25572 Pain in left ankle and joints of left foot: Secondary | ICD-10-CM

## 2024-04-17 DIAGNOSIS — R600 Localized edema: Secondary | ICD-10-CM

## 2024-04-17 DIAGNOSIS — R262 Difficulty in walking, not elsewhere classified: Secondary | ICD-10-CM

## 2024-04-17 DIAGNOSIS — M6281 Muscle weakness (generalized): Secondary | ICD-10-CM

## 2024-04-21 ENCOUNTER — Encounter: Payer: Self-pay | Admitting: Radiology

## 2024-04-30 NOTE — Therapy (Incomplete)
 OUTPATIENT PHYSICAL THERAPY LOWER EXTREMITY TREATMENT   Patient Name: Erin Dudley MRN: [id number] DOB:2006-01-21, 18 y.o., female Today's Date: 04/30/2024  END OF SESSION:        No past medical history on file. Past Surgical History:  Procedure Laterality Date   NO PAST SURGERIES     ORIF ANKLE FRACTURE Left 01/23/2024   Procedure: OPEN REDUCTION INTERNAL FIXATION (ORIF) ANKLE FRACTURE, LEFT;  Surgeon: Harden Jerona GAILS, MD;  Location: [facility] OR;  Service: Orthopedics;  Laterality: Left;  left ankle fracture   Patient Active Problem List   Diagnosis Date Noted   Closed bimalleolar fracture of left ankle 01/23/2024   Normal labor 10/02/2021   SVD (spontaneous vaginal delivery) 10/02/2021   Iron  deficiency anemia 10/02/2021   Normal postpartum course 10/02/2021    PCP: no pcp  REFERRING PROVIDER: [name], NP   REFERRING DIAG:  Z98.890,Z87.81 (ICD-10-CM) - S/P ORIF (open reduction internal fixation) fracture  S82.842D (ICD-10-CM) - Closed bimalleolar fracture of left ankle with routine healing, subsequent encounter  M25.572 (ICD-10-CM) - Pain in left ankle and joints of left foot    THERAPY DIAG:  No diagnosis found.  Rationale for Evaluation and Treatment: Rehabilitation  ONSET DATE: 01/18/24 Fx; 01/23/24 Sx  SUBJECTIVE:   SUBJECTIVE STATEMENT: Pt reports her L ankle has been more sore and stiff with the colder weather. Pt reports the most significant pain with her arch.  EVAL: Pt reports she is doing OK. Walking is better, but the ankle is stiff. She notes pain primarily of the medial ankle and she is still having N/T of her toes. Pain and swelling increase with prolonged time on her feet. Pt states she injured her ankle on a water slide.   PERTINENT HISTORY: 03/05/24 Office note, [name], NP: The patient is a 18 year old female who presents status post left ankle open reduction internal fixation for bimalleolar ankle fracture she has been full  weightbearing in the cam boot she continues to have a little bit of discomfort laterally distal to the malleolus she has some associated tingling she is also having mild discomfort medially to the distal ankle this aching is intermittent and occurs with activity and rest   She does feel unstable as concerned she will need some assistance regaining strengthening in the ankle.  Plan: She will advance to regular shoewear given an ASO she may use this for the next 4 weeks.  She will try to wean out of it we will use this just for activities.  She will not use the ASO at physical therapy.  Have provided a referral to PT she will follow-up in office in 6 weeks if she fails to improve as expected   PAIN:  Are you having pain? Yes: NPRS scale: low pain and then random sharp pain of 3/10. 7-8/10 at night Pain location: R ankle Pain description: ache Aggravating factors: Prolonged time on feet, walking up hill, night Relieving factors: Elevation  PRECAUTIONS: To wear ASO brace outside of PT  RED FLAGS: None   WEIGHT BEARING RESTRICTIONS: No, WBAT  FALLS:  Has patient fallen in last 6 months? No  LIVING ENVIRONMENT: Lives with: lives with their family Lives in: House/apartment Stairs: Yes: External: 4 steps; can reach both Has following equipment at home: None  OCCUPATION: Secretary  PLOF: Independent  PATIENT GOALS: less pain and improved function  NEXT MD VISIT: no additional appts at tihis time unless issues  OBJECTIVE:  Note: Objective measures were completed at Evaluation unless  otherwise noted.  DIAGNOSTIC FINDINGS: 02/20/24: Radiographs of the left ankle show stable alignment and fixation hardware.   There is no complicating feature.   PATIENT SURVEYS:  LEFS: 40/80= 50%  COGNITION: Overall cognitive status: Within functional limits for tasks assessed     SENSATION: WFL N/T of toes  EDEMA:  Present of L ankle and lower leg  POSTURE: No Significant postural  limitations  PALPATION: TTP of the L peri-ankle  LOWER EXTREMITY ROM:  WFLS for L hip and knee Active ROM Right eval Left eval LT 10/825  Hip flexion     Hip extension     Hip abduction     Hip adduction     Hip internal rotation     Hip external rotation     Knee flexion     Knee extension     Ankle dorsiflexion 17 0 p 10d  Ankle plantarflexion 50 30 p   Ankle inversion 30 20 p   Ankle eversion 25 18 p   P=pain  (Blank rows = not tested)  LOWER EXTREMITY MMT:  MMT Right eval Left eval  Hip flexion    Hip extension    Hip abduction    Hip adduction    Hip internal rotation    Hip external rotation    Knee flexion    Knee extension    Ankle dorsiflexion 5 4 p  Ankle plantarflexion 5 4 p  Ankle inversion 5 4 p  Ankle eversion 5 4 p   P=pain (Blank rows = not tested)  FUNCTIONAL TESTS:  5 times sit to stand: TBA 2 minute walk test: TBA  Single leg stance: TBA   GAIT: Distance walked: 150' Assistive device utilized: None Level of assistance: Complete Independence Comments: Decreased step length R due to decreased L ankle DF ROM and pain                                                                                                                      TREATMENT DATE:  [facility] Adult PT Treatment:                                                DATE: 05/01/24 Therapeutic Activity: Rec bike 5 mins L3  BAPS board L1 Standing stretch for gastroc and soleus Wooden rocker board A/P [employer] rocker board laterally Lateral steps on [employer] on airex Heel raises 2x10 Side steps c RTB Therapeutic Exercise: *** Manual Therapy: *** Neuromuscular re-ed: *** Therapeutic Activity: *** Modalities: *** Self Care: ***  [facility] Adult PT Treatment:                                                DATE: 04/17/24 Therapeutic Activity: Rec  bike 5 mins L3  BAPS board L1 Standing stretch for gastroc and soleus Wooden rocker board A/P [employer] rocker board  laterally Lateral steps on airex Marching on airex Heel raises 2x10 Side steps c RTB  [facility] Adult PT Treatment:                                                DATE: 04/08/24 Therapeutic Activity: Rec bike 5 mins L3  2 MWT=555' Slant board stretch for gastroc and soleus SL stance, bilat L=10, R=20+ Haematologist A/[employer] laterally Tandem on airex 30' Marching on airex  PATIENT EDUCATION:  Education details: Eval findings, POC, HEP, self care  Person educated: Patient Education method: Explanation, Demonstration, Tactile cues, Verbal cues, and Handouts Education comprehension: verbalized understanding, returned demonstration, verbal cues required, and tactile cues required  HOME EXERCISE PROGRAM: Access Code: [email or web address] URL: [email or web address] Date: 03/26/2024 Prepared by: Dasie Daft  Exercises - Supine Ankle Pumps  - 2 x daily - 7 x weekly - 1 sets - 15 reps - Supine Ankle Circles  - 2 x daily - 7 x weekly - 1 sets - 15 reps - Long Sitting Calf Stretch with Strap  - 2 x daily - 7 x weekly - 1 sets - 3 reps - 30 hold - Long Sitting Ankle Plantar Flexion with Resistance  - 1 x daily - 7 x weekly - 3 sets - 10-15 reps - 3 hold - Long Sitting Ankle Inversion with Resistance (Mirrored)  - 2 x daily - 7 x weekly - 1 sets - 10-15 reps - 3 hold - Seated Ankle Eversion with Resistance  - 2 x daily - 7 x weekly - 1 sets - 10-15 reps - 3 hold - Seated Ankle Dorsiflexion with Resistance (Mirrored)  - 1-2 x daily - 7 x weekly - 1 sets - 10-15 reps - 3 hold - Gastroc Stretch on Wall  - 1 x daily - 7 x weekly - 1 sets - 3 reps - 30 hold - Soleus Stretch on Wall  - 1 x daily - 7 x weekly - 1 sets - 3 reps - 30 hold - Heel Raises with Counter Support  - 1 x daily - 7 x weekly - 3 sets - 10 reps - 3 hold - Standing Single Leg Stance with Counter Support  - 1 x daily - 7 x weekly - 1 sets - 5 reps - 30 hold Verbally added toe scrunches and staggered  STS  ASSESSMENT:  CLINICAL IMPRESSION: PT was completed for ROM, strengthening, and therapeutic activities. Pt tolerated PT today with some complaints re: arch pain. Pt's walking pattern looks good with an equal step length and without antalgic deficit. Overall, function of the L ankle and foot is improved. Pt is to return in 2 weeks. Will complete a reassessment at that time and will make recommendations for DC or continuation of PT.   EVAL: Patient is a 18 y.o. female who was seen today for physical therapy evaluation and treatment for  Z98.890,Z87.81 (ICD-10-CM) - S/P ORIF (open reduction internal fixation) fracture  S82.842D (ICD-10-CM) - Closed bimalleolar fracture of left ankle with routine healing, subsequent encounter  M25.572 (ICD-10-CM) - Pain in left ankle and joints of left foot  Pt presents to PT with expected swelling and pain of the L ankle, and ROM, strength,  and functional limitations related to her Dx. Pt is WBAT, but has a gait deficit of decreased step length R, due to decreased L ankle DF ROM and pain. A HEP and self care was provided to the pt as above. Pt will benefit from skilled PT to address impairments to optimize L ankle function with less pain.   OBJECTIVE IMPAIRMENTS: decreased activity tolerance, decreased balance, difficulty walking, decreased ROM, decreased strength, and pain.   ACTIVITY LIMITATIONS: squatting, stairs, locomotion level, and caring for others  PARTICIPATION LIMITATIONS: meal prep, cleaning, and laundry  PERSONAL FACTORS: Past/current experiences are also affecting patient's functional outcome.   REHAB POTENTIAL: Good  CLINICAL DECISION MAKING: Evolving/moderate complexity  EVALUATION COMPLEXITY: Moderate   GOALS:  SHORT TERM GOALS: Target date: 04/04/24 Pt will be Ind in an initial HEP  Baseline:started Goal status: MET  2. Pt will voice understanding of measures to assist in pain and swelling reduction  Baseline:  04/08/24: Uses  elevation but does not like cold packs Goal status: MET  3.  Assess , 5xSTS, and single leg stance for functional measures Baseline: 04/08/24: 8.0=5xSTS; 555'= - stiffness of the L ankle, not antalgic Goal status: MET  LONG TERM GOALS: Target date: 05/16/24  Pt will be Ind in a final HEP to maintain achieved LOF  Baseline:  Goal status: INITIAL  2.  Improve 5xSTS by MCID of 5 and by MCID of 68ft as indication of improved functional mobility  Baseline: 04/08/24: 8.0=5xSTS; 555'= - stiffness of the L ankle, not antalgic Goal status: INITIAL  3.  Pt will report 50% or greater decrease in L ankle pain for improved function Baseline: 7/10 Goal status: INITIAL  4.  Increase L ankle AROMs to Df 10d, PF 40d, inv 24d, and ev 22d for appropriate functional mobility especially for walking with an improved quality of gait Baseline:  Goal status: INITIAL  5.  Pt will be able to complete single leg PF x10 for improved ankle strength with daily activities Baseline: Unable Goal status: INITIAL  6.  Pt will complete SL standing for the L LE within 85% of the R as demonstration of appropriate L ankle stability Baseline: 04/08/24= L 10; R 20+ Goal status: INITIAL  7. Pt's LEFS score will improved by the MCID to 65% or greater as indication of improved function  Baseline: 50% Goal status: INITIAL   PLAN:  PT FREQUENCY: 2x/week  PT DURATION: 8 weeks  PLANNED INTERVENTIONS: [id number]- PT Re-evaluation, 97110-Therapeutic exercises, 97530- Therapeutic activity, 97112- Neuromuscular re-education, 97535- Self Care, [id number]- Manual therapy, [id number]- Gait training, [phone]- Aquatic Therapy, Patient/Family education, Balance training, Stair training, Taping, Joint mobilization, Cryotherapy, and Moist heat  PLAN FOR NEXT SESSION: Assess response to HEP; progress therex as indicated; use of modalities, manual therapy; and TPDN as indicated.   Jamesa Tedrick MS, PT 04/30/24 5:47 PM

## 2024-05-01 ENCOUNTER — Ambulatory Visit: Attending: Family

## 2024-05-01 ENCOUNTER — Telehealth: Payer: Self-pay

## 2024-05-01 NOTE — Telephone Encounter (Signed)
 Call pt re: 2nd no show appt. Pt has no other appts schedule. Advised pt she can call for another appt, but if an appt is not scheduled in 2 weeks will DC from PT services.
